# Patient Record
Sex: Female | Born: 1970 | Race: Black or African American | Hispanic: No | Marital: Single | Smoking: Never smoker
Health system: Southern US, Community
[De-identification: ages and names within clinical notes are randomized; demographics above are authoritative.]

## PROBLEM LIST (undated history)

## (undated) DIAGNOSIS — M351 Other overlap syndromes: Secondary | ICD-10-CM

## (undated) DIAGNOSIS — I10 Essential (primary) hypertension: Secondary | ICD-10-CM

## (undated) DIAGNOSIS — G43909 Migraine, unspecified, not intractable, without status migrainosus: Secondary | ICD-10-CM

## (undated) DIAGNOSIS — K589 Irritable bowel syndrome without diarrhea: Secondary | ICD-10-CM

---

## 2004-01-14 HISTORY — PX: PARTIAL HYSTERECTOMY: SHX80

## 2019-12-03 ENCOUNTER — Emergency Department (HOSPITAL_COMMUNITY): Payer: BC Managed Care – PPO

## 2019-12-03 ENCOUNTER — Other Ambulatory Visit: Payer: Self-pay

## 2019-12-03 ENCOUNTER — Observation Stay (HOSPITAL_COMMUNITY)
Admission: EM | Admit: 2019-12-03 | Discharge: 2019-12-04 | Disposition: A | Discharge status: Home / Self Care | Payer: BC Managed Care – PPO | Attending: Internal Medicine | Admitting: Internal Medicine

## 2019-12-03 ENCOUNTER — Encounter (HOSPITAL_COMMUNITY): Payer: Self-pay

## 2019-12-03 DIAGNOSIS — G4733 Obstructive sleep apnea (adult) (pediatric): Secondary | ICD-10-CM | POA: Insufficient documentation

## 2019-12-03 DIAGNOSIS — L949 Localized connective tissue disorder, unspecified: Secondary | ICD-10-CM | POA: Insufficient documentation

## 2019-12-03 DIAGNOSIS — I10 Essential (primary) hypertension: Secondary | ICD-10-CM | POA: Diagnosis not present

## 2019-12-03 DIAGNOSIS — G43909 Migraine, unspecified, not intractable, without status migrainosus: Secondary | ICD-10-CM | POA: Diagnosis not present

## 2019-12-03 DIAGNOSIS — Z20822 Contact with and (suspected) exposure to covid-19: Secondary | ICD-10-CM | POA: Insufficient documentation

## 2019-12-03 DIAGNOSIS — G459 Transient cerebral ischemic attack, unspecified: Secondary | ICD-10-CM | POA: Diagnosis not present

## 2019-12-03 DIAGNOSIS — R531 Weakness: Secondary | ICD-10-CM

## 2019-12-03 DIAGNOSIS — R29898 Other symptoms and signs involving the musculoskeletal system: Secondary | ICD-10-CM | POA: Diagnosis present

## 2019-12-03 HISTORY — DX: Essential (primary) hypertension: I10

## 2019-12-03 HISTORY — DX: Other overlap syndromes: M35.1

## 2019-12-03 HISTORY — DX: Irritable bowel syndrome without diarrhea: K58.9

## 2019-12-03 HISTORY — DX: Migraine, unspecified, not intractable, without status migrainosus: G43.909

## 2019-12-03 LAB — I-STAT CHEM 8, ED
BUN: 5 mg/dL — ABNORMAL LOW (ref 6–20)
Calcium, Ion: 1 mmol/L — ABNORMAL LOW (ref 1.15–1.40)
Chloride: 109 mmol/L (ref 98–111)
Creatinine, Ser: 0.9 mg/dL (ref 0.44–1.00)
Glucose, Bld: 105 mg/dL — ABNORMAL HIGH (ref 70–99)
HCT: 41 % (ref 36.0–46.0)
Hemoglobin: 13.9 g/dL (ref 12.0–15.0)
Potassium: 4.5 mmol/L (ref 3.5–5.1)
Sodium: 138 mmol/L (ref 135–145)
TCO2: 21 mmol/L — ABNORMAL LOW (ref 22–32)

## 2019-12-03 LAB — COMPREHENSIVE METABOLIC PANEL
ALT: 13 U/L (ref 0–44)
AST: 18 U/L (ref 15–41)
Albumin: 3.5 g/dL (ref 3.5–5.0)
Alkaline Phosphatase: 68 U/L (ref 38–126)
Anion gap: 11 (ref 5–15)
BUN: 6 mg/dL (ref 6–20)
CO2: 21 mmol/L — ABNORMAL LOW (ref 22–32)
Calcium: 9 mg/dL (ref 8.9–10.3)
Chloride: 105 mmol/L (ref 98–111)
Creatinine, Ser: 0.95 mg/dL (ref 0.44–1.00)
GFR, Estimated: >60 mL/min (ref >60)
Glucose, Bld: 106 mg/dL — ABNORMAL HIGH (ref 70–99)
Potassium: 3.8 mmol/L (ref 3.5–5.1)
Sodium: 137 mmol/L (ref 135–145)
Total Bilirubin: 0.6 mg/dL (ref 0.3–1.2)
Total Protein: 7 g/dL (ref 6.5–8.1)

## 2019-12-03 LAB — CBC
HCT: 42.1 % (ref 36.0–46.0)
Hemoglobin: 12.8 g/dL (ref 12.0–15.0)
MCH: 26.2 pg (ref 26.0–34.0)
MCHC: 30.4 g/dL (ref 30.0–36.0)
MCV: 86.1 fL (ref 80.0–100.0)
Platelets: 292 10*3/uL (ref 150–400)
RBC: 4.89 MIL/uL (ref 3.87–5.11)
RDW: 14.8 % (ref 11.5–15.5)
WBC: 10.7 10*3/uL — ABNORMAL HIGH (ref 4.0–10.5)
nRBC: 0 % (ref 0.0–0.2)

## 2019-12-03 LAB — DIFFERENTIAL
Abs Immature Granulocytes: 0.05 10*3/uL (ref 0.00–0.07)
Basophils Absolute: 0 10*3/uL (ref 0.0–0.1)
Basophils Relative: 0 %
Eosinophils Absolute: 0.2 10*3/uL (ref 0.0–0.5)
Eosinophils Relative: 2 %
Immature Granulocytes: 1 %
Lymphocytes Relative: 44 %
Lymphs Abs: 4.7 10*3/uL — ABNORMAL HIGH (ref 0.7–4.0)
Monocytes Absolute: 0.6 10*3/uL (ref 0.1–1.0)
Monocytes Relative: 5 %
Neutro Abs: 5.1 10*3/uL (ref 1.7–7.7)
Neutrophils Relative %: 48 %

## 2019-12-03 LAB — RESPIRATORY PANEL BY RT PCR (FLU A&B, COVID)
Influenza A by PCR: NEGATIVE
Influenza B by PCR: NEGATIVE
SARS Coronavirus 2 by RT PCR: NEGATIVE

## 2019-12-03 LAB — CBG MONITORING, ED: Glucose-Capillary: 179 mg/dL — ABNORMAL HIGH (ref 70–99)

## 2019-12-03 LAB — PROTIME-INR
INR: 1 (ref 0.8–1.2)
Prothrombin Time: 13.1 seconds (ref 11.4–15.2)

## 2019-12-03 LAB — I-STAT BETA HCG BLOOD, ED (MC, WL, AP ONLY): I-stat hCG, quantitative: <5 m[IU]/mL (ref <5)

## 2019-12-03 LAB — APTT: aPTT: 30 seconds (ref 24–36)

## 2019-12-03 IMAGING — MR MR HEAD W/O CM
1 series · 15 of 15 positions shown · non-contrast
Comparison: Plain head CT [W1] hours.

CLINICAL DATA: 49-year-old female code stroke presentation. Acute
left side weakness and numbness, expressive aphasia.

EXAM:
MRI HEAD WITHOUT CONTRAST
MRA HEAD WITHOUT CONTRAST
TECHNIQUE: Multiplanar, multiecho pulse sequences of the brain and surrounding
structures were obtained without intravenous contrast. Angiographic
images of the head were obtained using MRA technique without
contrast.

[Series 301: pjn:ax (id) · sagittal · 1.0mm · 0.43mm/px · 15 of 15 slices shown]
[im 1/15]
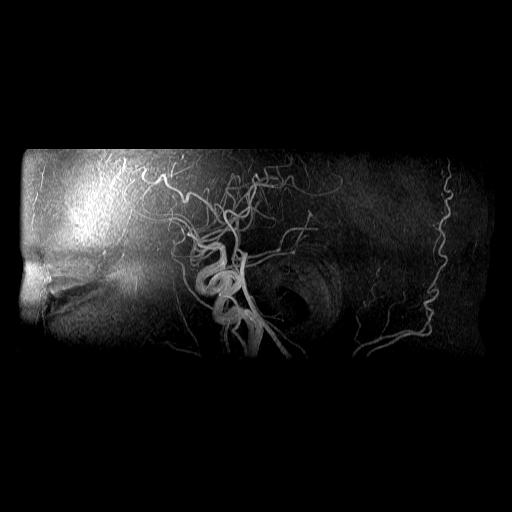
[im 2/15]
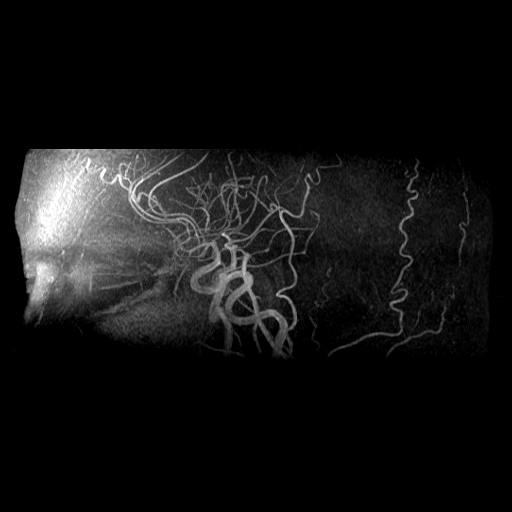
[im 3/15]
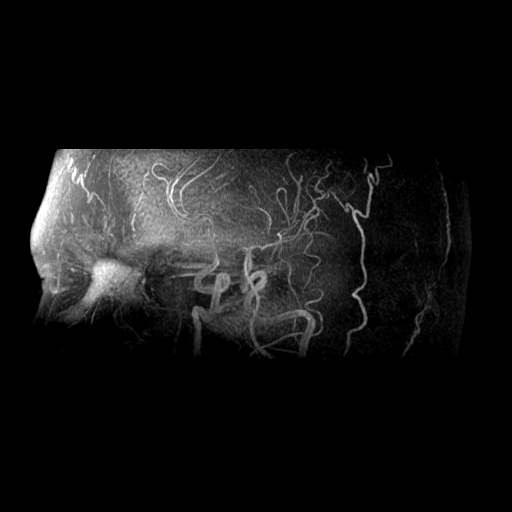
[im 4/15]
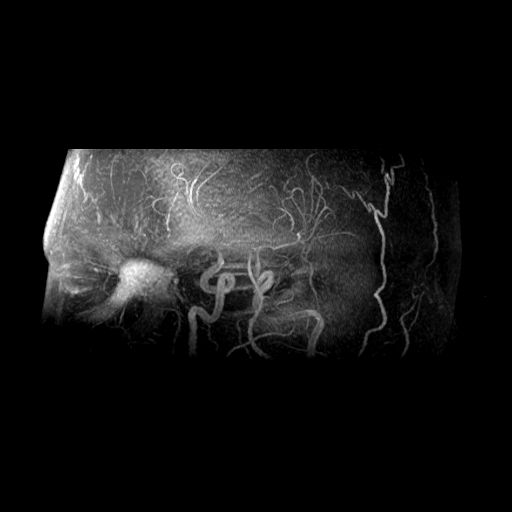
[im 5/15]
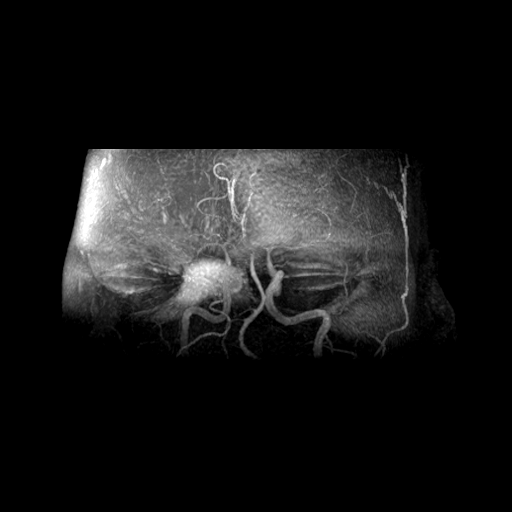
[im 6/15]
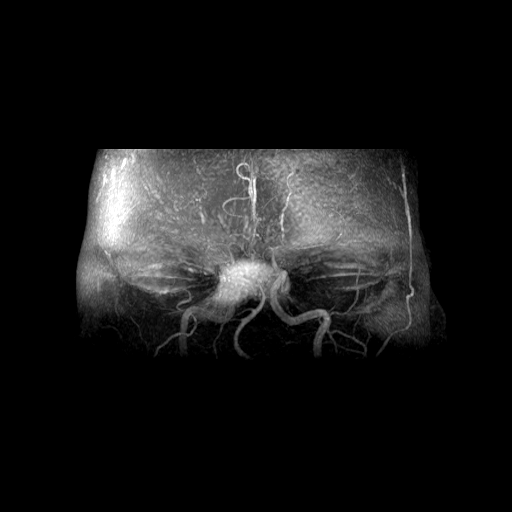
[im 7/15]
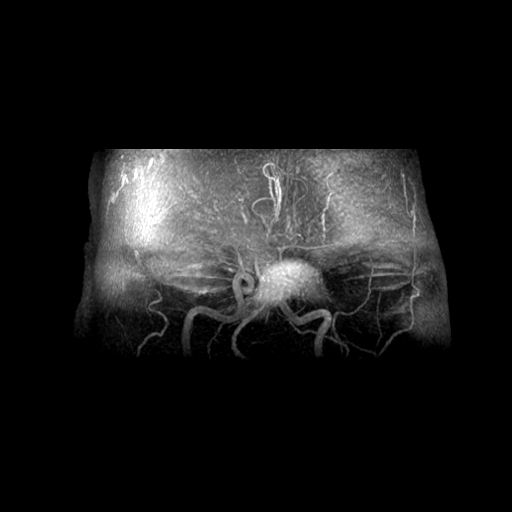
[im 8/15]
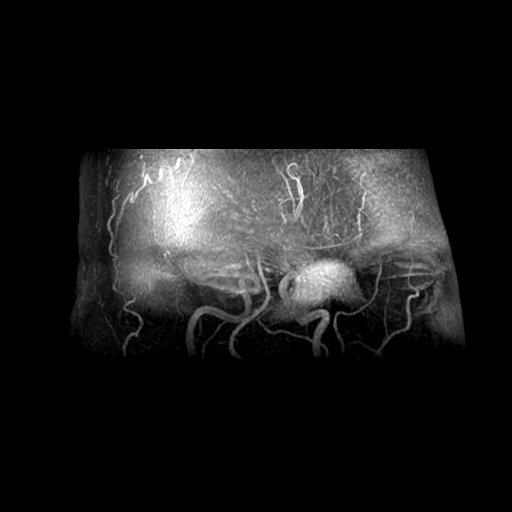
[im 9/15]
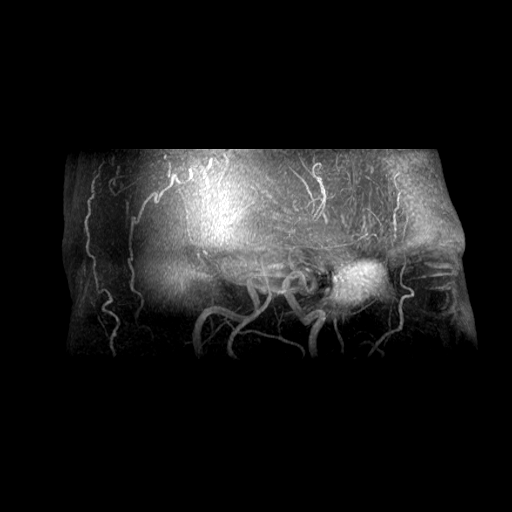
[im 10/15]
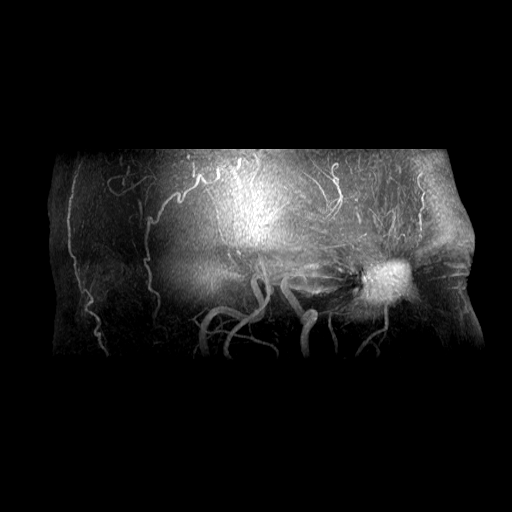
[im 11/15]
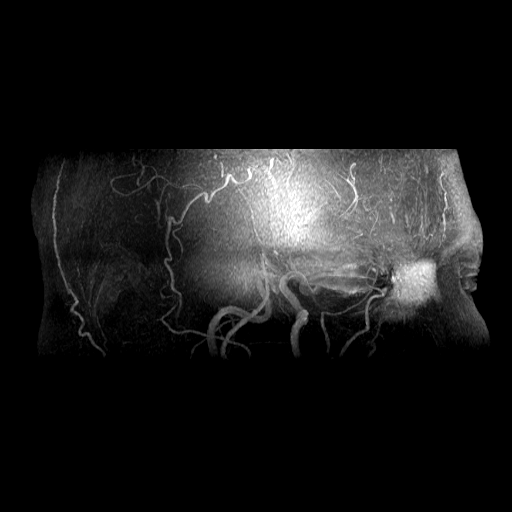
[im 12/15]
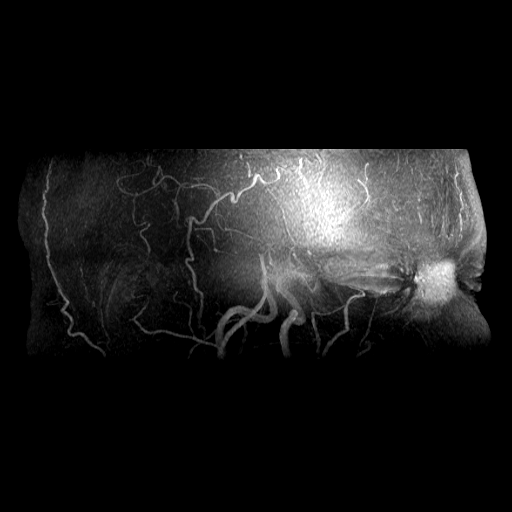
[im 13/15]
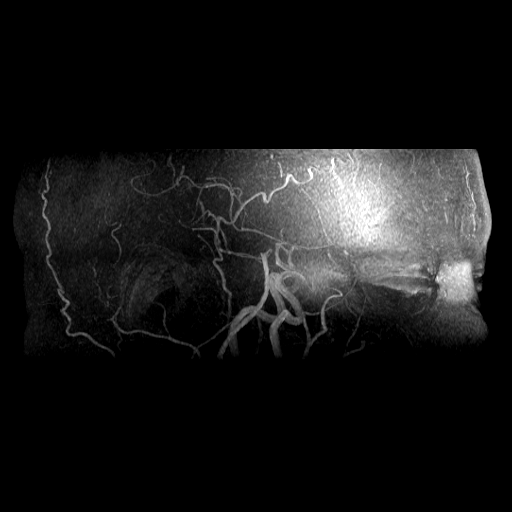
[im 14/15]
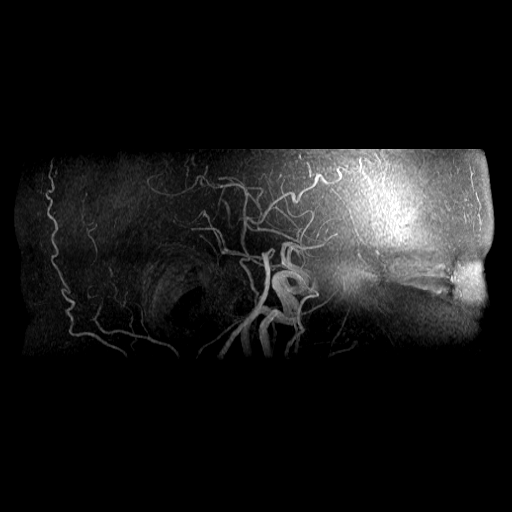
[im 15/15]
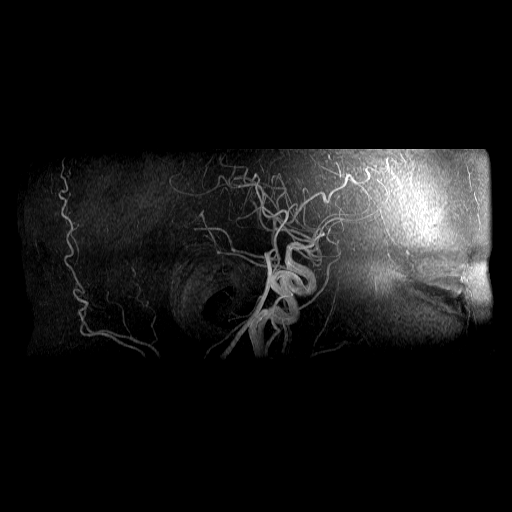

[15 of 15 positions shown; findings below may reference images not displayed]

FINDINGS: MRI HEAD FINDINGS

Brain: No restricted diffusion or evidence of acute infarction.
No midline shift, mass effect, evidence of mass lesion,
ventriculomegaly, extra-axial collection or acute intracranial
hemorrhage. Cervicomedullary junction and pituitary are within
normal limits.

Gray and white matter signal is within normal limits throughout the
brain. No convincing encephalomalacia. No chronic cerebral blood
products identified.

Vascular: Major intracranial vascular flow voids are preserved.

Skull and upper cervical spine: Negative visible cervical spine.
Visualized bone marrow signal is within normal limits.

Sinuses/Orbits: Negative orbits.  Paranasal sinuses are clear.

Other: Mastoids are clear. Grossly normal visible internal auditory
structures.

Mild scaphocephaly. Visible scalp and face soft tissues appear
negative.

MRA HEAD FINDINGS

Antegrade flow in the posterior circulation with dominant appearing
distal left vertebral artery. No distal vertebral stenosis. Patent
vertebrobasilar junction. Patent dominant appearing bilateral AICA
origins. Patent basilar artery without stenosis. Patent SCA
(duplicated on the left, normal variant) and right PCA origin. Fetal
type left PCA origin. Right posterior communicating artery also
present. Tortuous right P1 segment. Bilateral PCA branches are
within normal limits.

Antegrade flow in both ICA siphons. Mildly tortuous cavernous ICAs.
No siphon stenosis. Bilateral ophthalmic and posterior communicating
artery origins appear normal. Patent carotid termini, MCA and ACA
origins. Anterior communicating artery and visible ACA branches are
within normal limits. Bilateral MCA M1 segments and MCA
bi/trifurcations are patent. The right MCA bifurcates early, normal
variant. Visible bilateral MCA branches are within normal limits.
IMPRESSION: 1. Normal noncontrast MRI appearance of the brain.

2. Negative intracranial MRA.

## 2019-12-03 IMAGING — CT CT HEAD CODE STROKE
4 series · 16 of 47 positions shown, 18 images · non-contrast
Comparison: None.

CLINICAL DATA: Code stroke. 49-year-old female with left side
weakness and aphasia.

EXAM:
CT HEAD WITHOUT CONTRAST
TECHNIQUE: Contiguous axial images were obtained from the base of the skull
through the vertex without intravenous contrast.

[Series 3: head wo · axial · 0.43mm/px · z∈[+1110,+1224]mm · 7 of 31 slices shown, 9 images]
[im 4/31  brain]
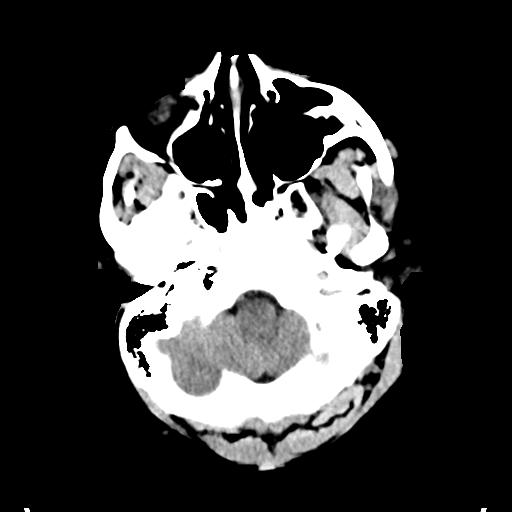
[im 4/31  bone]
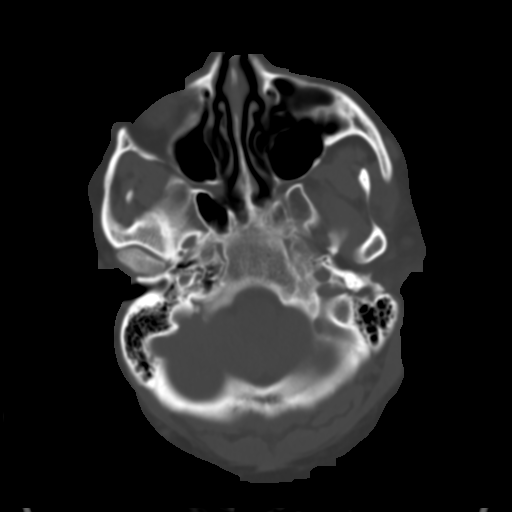
[im 8/31  brain]
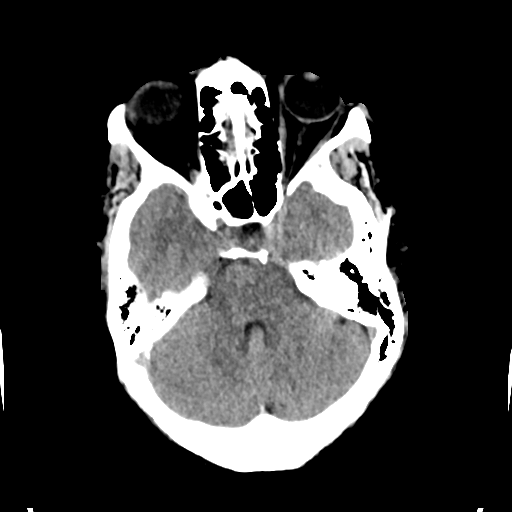
[im 12/31  brain]
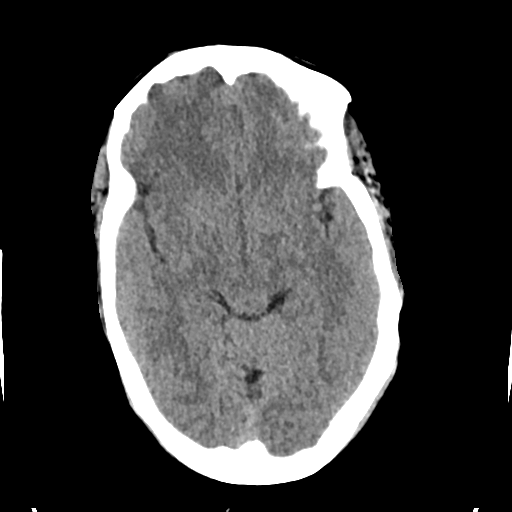
[im 16/31  brain]
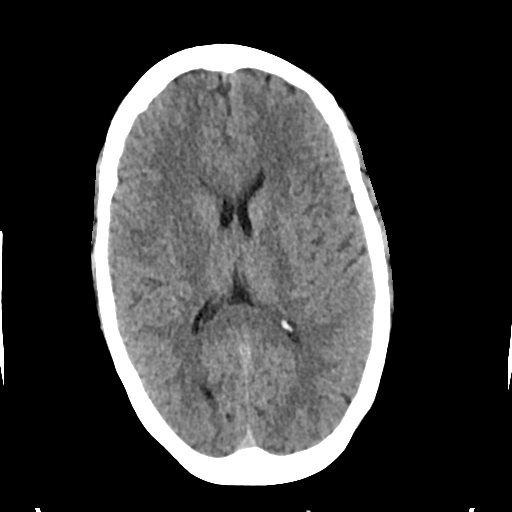
[im 19/31  brain]
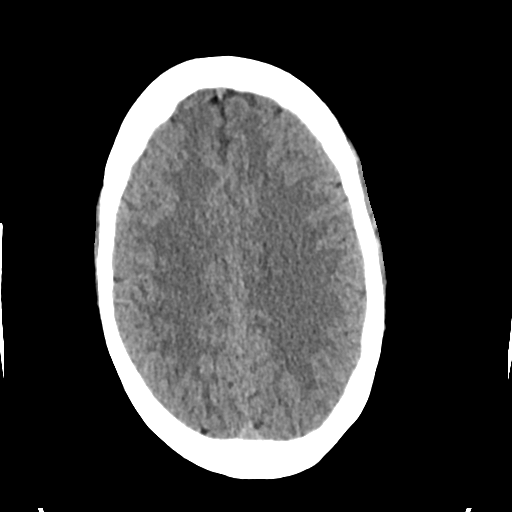
[im 19/31  bone]
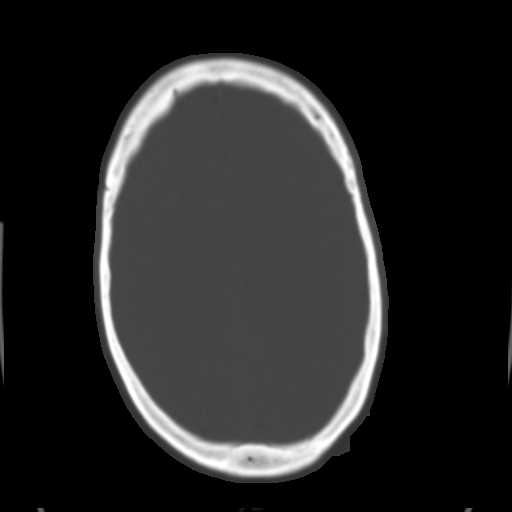
[im 23/31  brain]
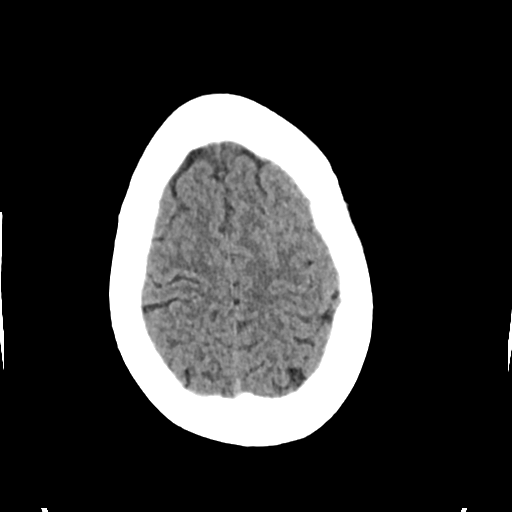
[im 27/31  brain]
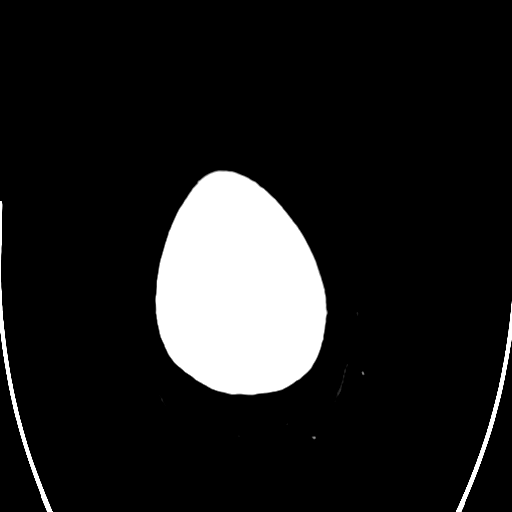

[Series 4: head bone · axial · 0.43mm/px · z∈[+1108,+1138]mm · 3 of 76 slices shown]
[im 8/76  bone]
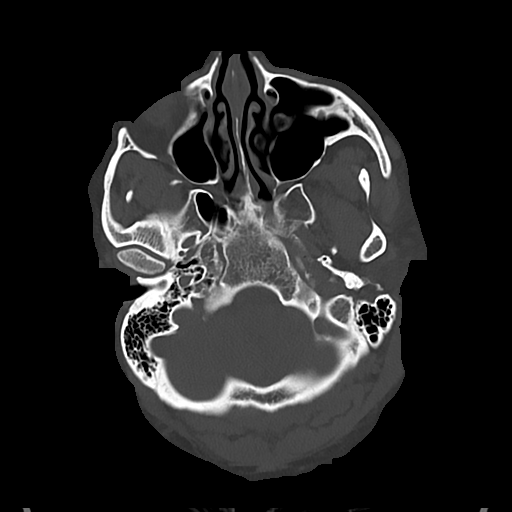
[im 16/76  bone]
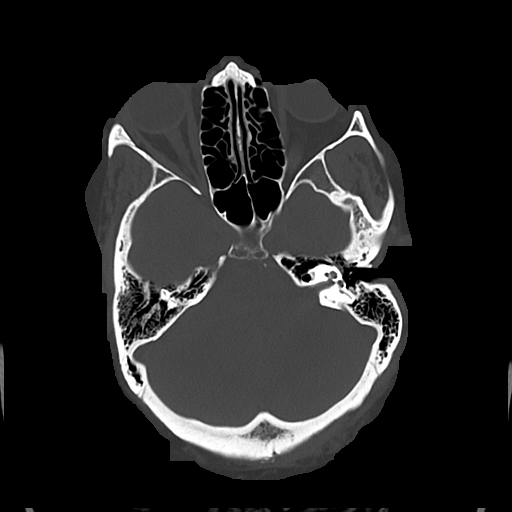
[im 23/76  bone]
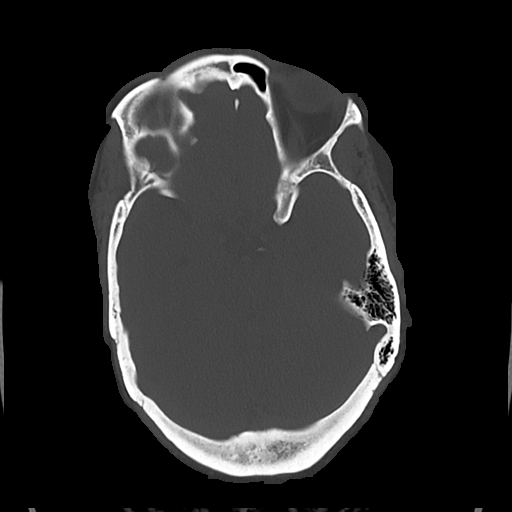

[Series 5: cor soft · coronal · 0.33mm/px · 3 of 69 slices shown]
[im 23/69  brain]
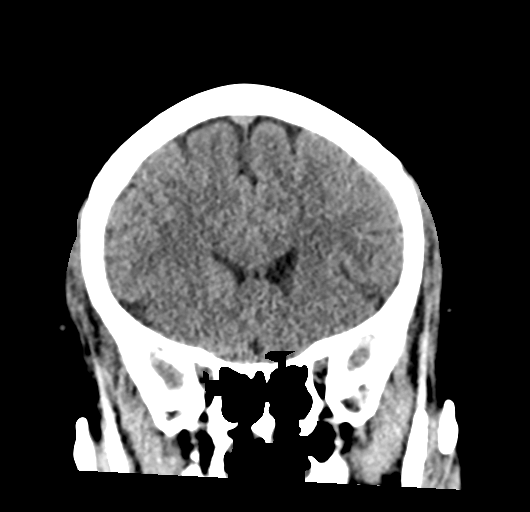
[im 31/69  brain]
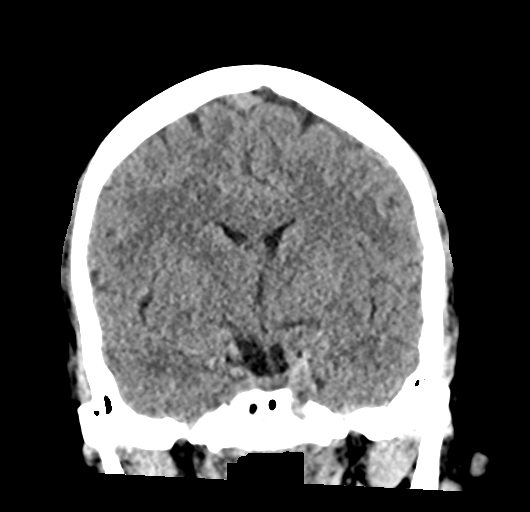
[im 38/69  brain]
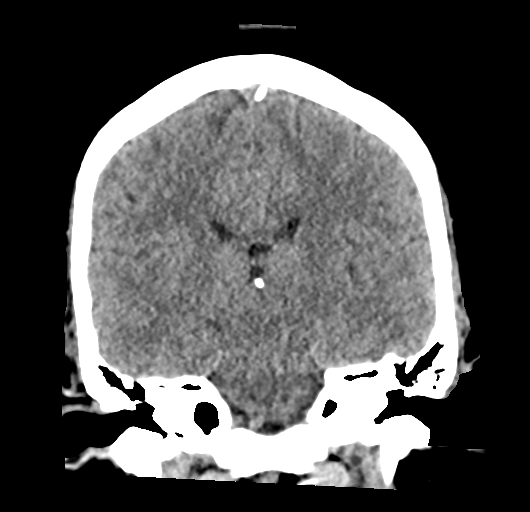

[Series 6: sag soft · sagittal · 0.33mm/px · 3 of 48 slices shown]
[im 16/48  brain]
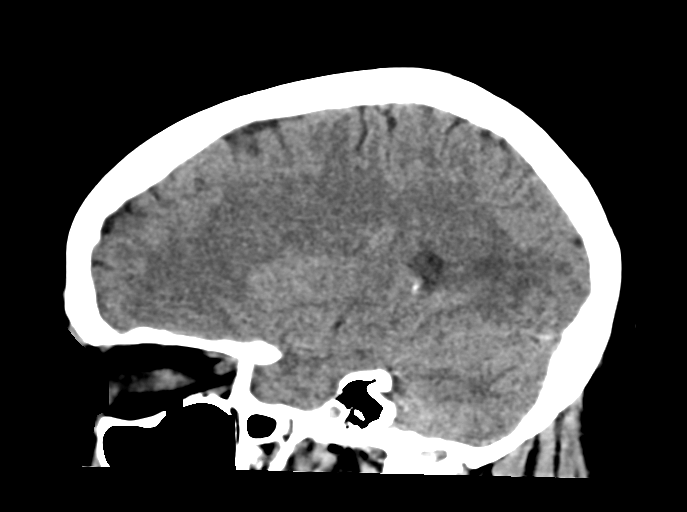
[im 24/48  brain]
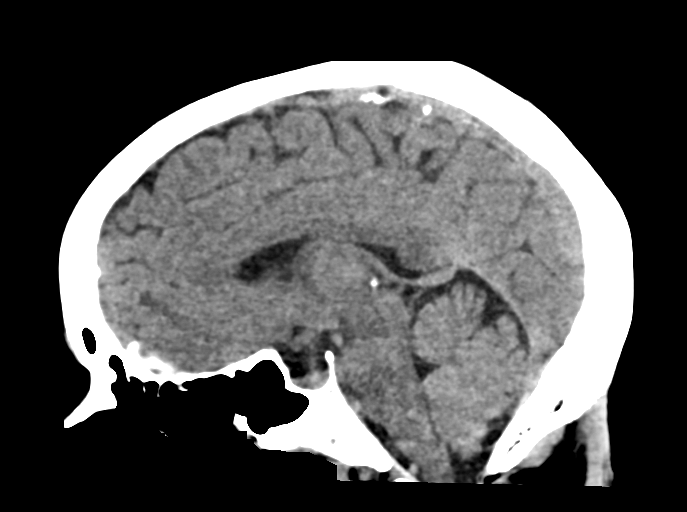
[im 32/48  brain]
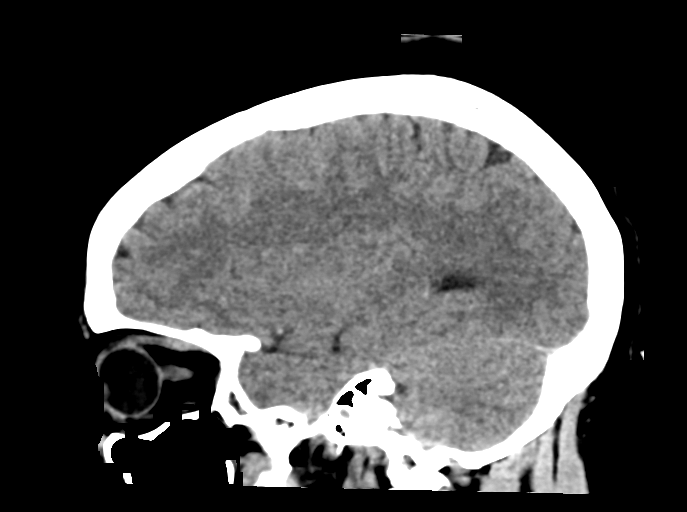

[16 of 47 positions shown; findings below may reference images not displayed]

FINDINGS: Brain: Mild scaphocephaly.  Normal cerebral volume.

No midline shift, ventriculomegaly, mass effect, evidence of mass
lesion, intracranial hemorrhage or evidence of cortically based
acute infarction. Gray-white matter differentiation is within normal
limits throughout the brain.

Vascular: No suspicious intracranial vascular hyperdensity.

Skull: Small benign right frontal bone lesions suspected on series
4, image 27. Otherwise negative.

Sinuses/Orbits: Visualized paranasal sinuses and mastoids are stable
and well pneumatized.

Other: Visualized orbits and scalp soft tissues are within normal
limits.

ASPECTS (Alberta Stroke Program Early CT Score)

Total score (0-10 with 10 being normal): 10
IMPRESSION: Normal noncontrast CT appearance of the brain.  ASPECTS 10.

These results were communicated to Dr. CIPRIANI at [DATE] on
[DATE] by text page via the [employer] messaging system.

## 2019-12-03 IMAGING — MR MR MRA HEAD W/O CM
9 of 11 series · 31 of 48 positions shown · non-contrast
Comparison: Plain head CT [W1] hours.

CLINICAL DATA: 49-year-old female code stroke presentation. Acute
left side weakness and numbness, expressive aphasia.

EXAM:
MRI HEAD WITHOUT CONTRAST
MRA HEAD WITHOUT CONTRAST
TECHNIQUE: Multiplanar, multiecho pulse sequences of the brain and surrounding
structures were obtained without intravenous contrast. Angiographic
images of the head were obtained using MRA technique without
contrast.

[Series 3: ax (id) · axial · 1.0mm · 0.43mm/px · z∈[-160,-110]mm · 5 of 184 slices shown]
[im 1/184]
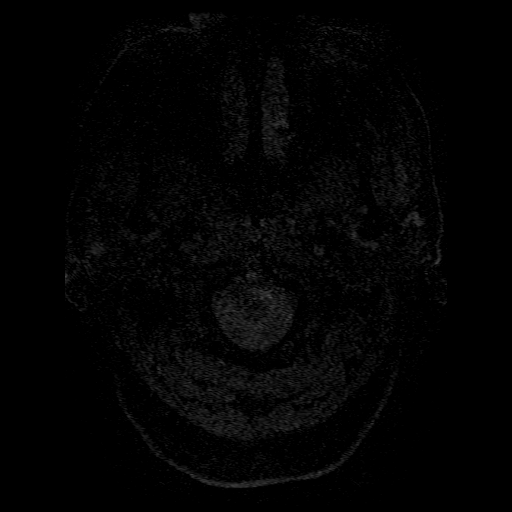
[im 37/184]
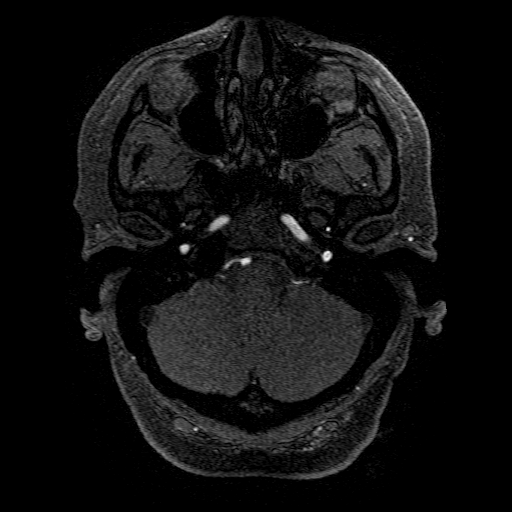
[im 55/184]
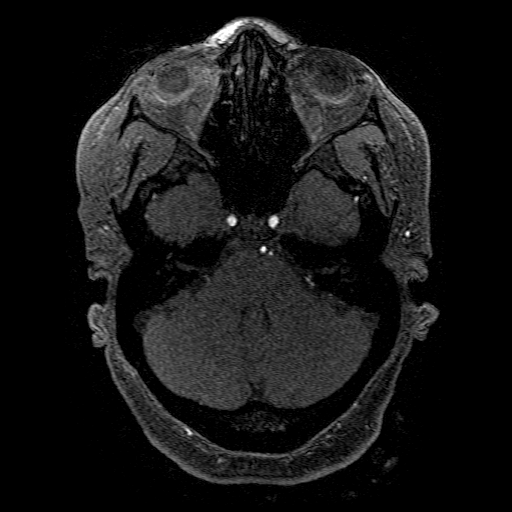
[im 74/184]
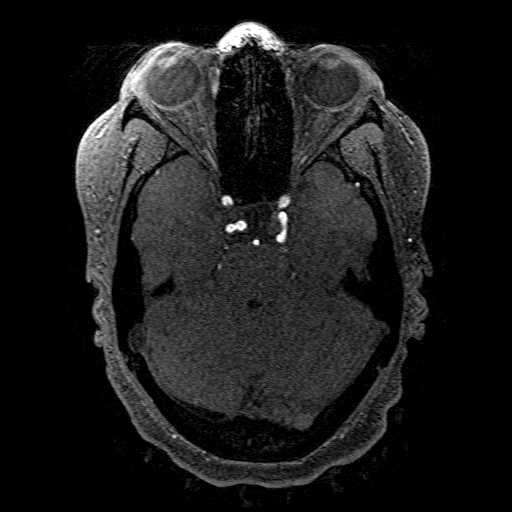
[im 110/184]
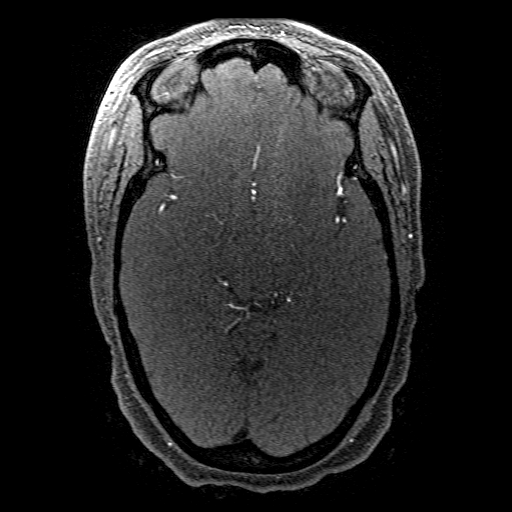

[Series 4: DWI · axial · 3.0mm · 0.94mm/px · z∈[-177,-43]mm · 7 of 100 slices shown (1 of 2)]
[im 1/100]
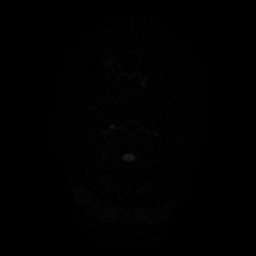
[im 17/100]
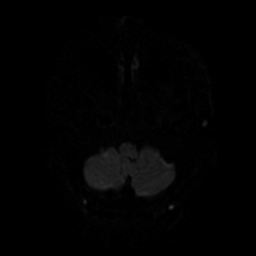
[im 34/100]
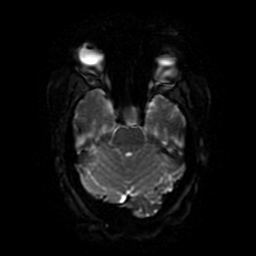
[im 50/100]
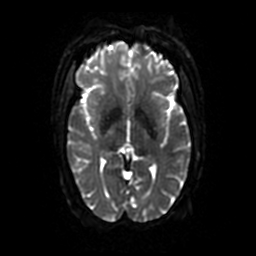
[im 67/100]
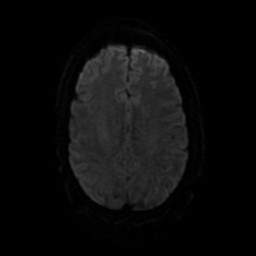
[im 83/100]
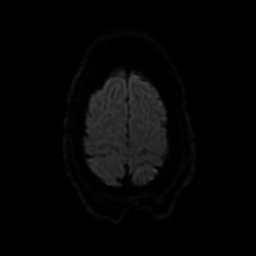
[im 100/100]
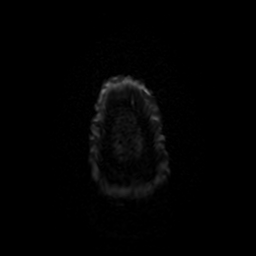

[Series 5: DWI · coronal · 4.0mm · 0.94mm/px · 5 of 74 slices shown (2 of 2)]
[im 1/74]
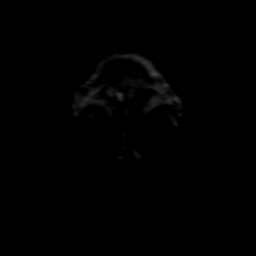
[im 19/74]
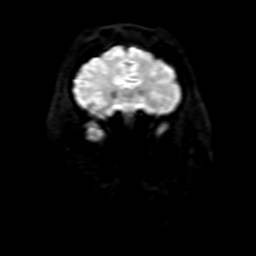
[im 37/74]
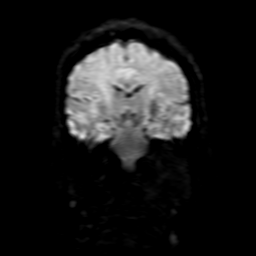
[im 55/74]
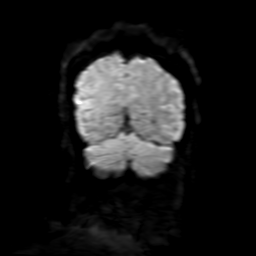
[im 74/74]
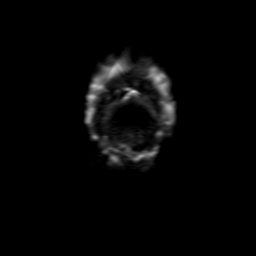

[Series 6: FLAIR · axial · 3.0mm · 0.45mm/px · z∈[-192,-63]mm · 2 of 26 slices shown (1 of 2)]
[im 1/26]
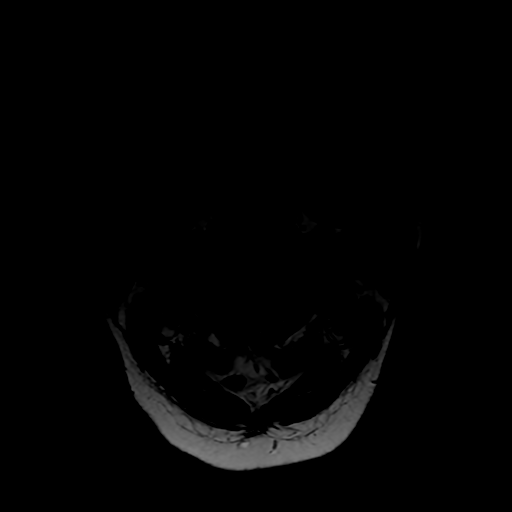
[im 26/26]
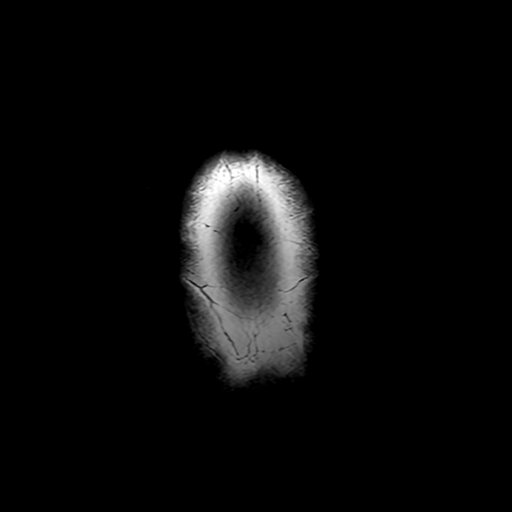

[Series 8: T2 · axial · 5.0mm · 0.47mm/px · z∈[-195,-65]mm · 2 of 26 slices shown (1 of 2)]
[im 1/26]
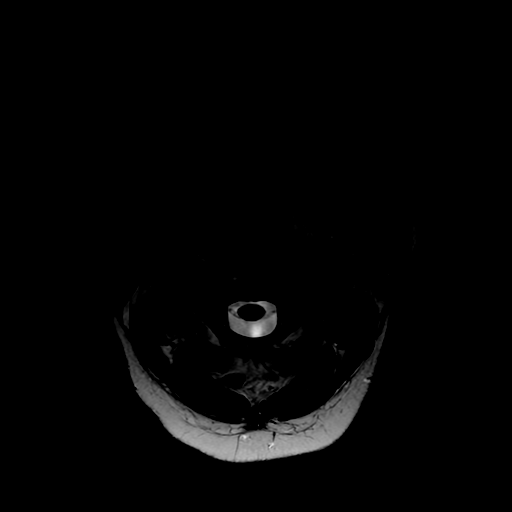
[im 26/26]
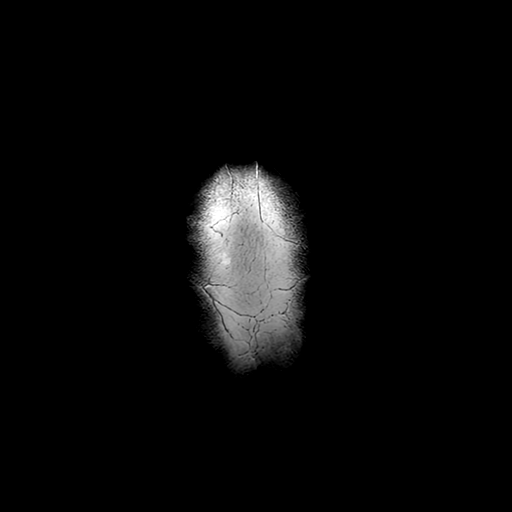

[Series 9: FLAIR · sagittal · 5.0mm · 0.47mm/px · 2 of 23 slices shown (2 of 2)]
[im 1/23]
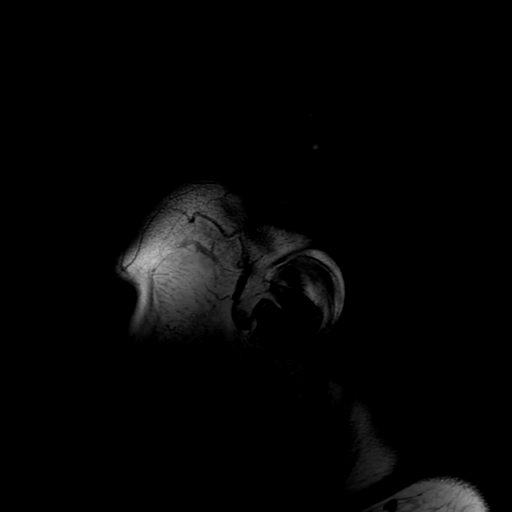
[im 23/23]
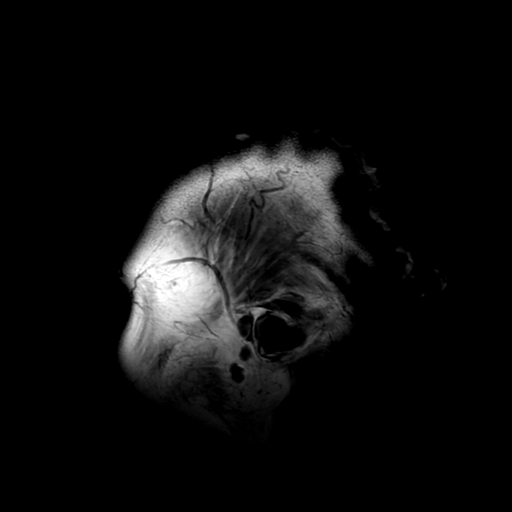

[Series 11: T2 · coronal · 5.0mm · 0.39mm/px · 2 of 31 slices shown (2 of 2)]
[im 1/31]
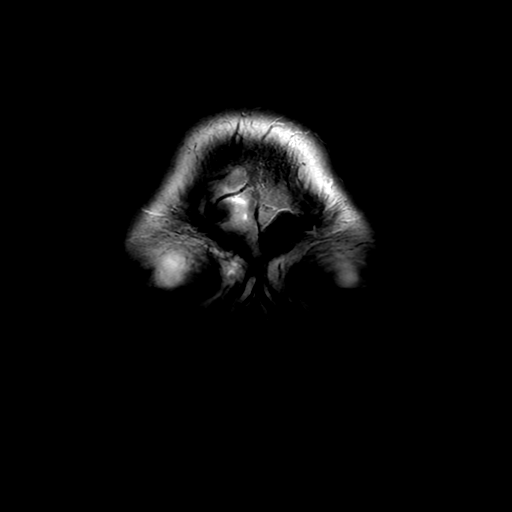
[im 31/31]
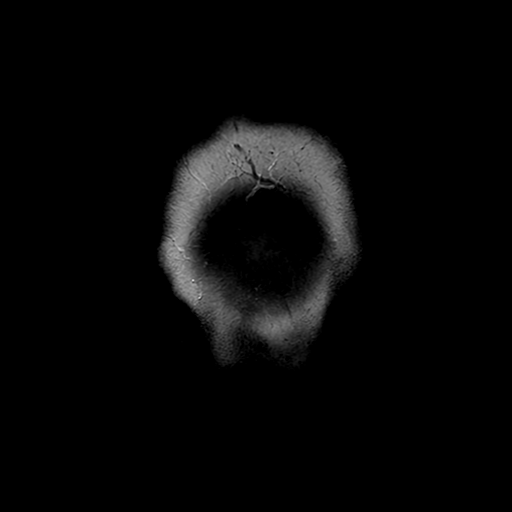

[Series 450: ADC · axial · 3.0mm · 0.94mm/px · z∈[-177,-43]mm · 3 of 50 slices shown (1 of 2)]
[im 1/50]
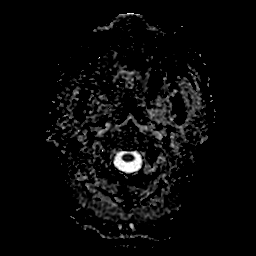
[im 25/50]
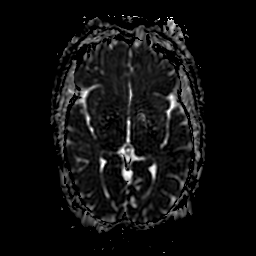
[im 50/50]
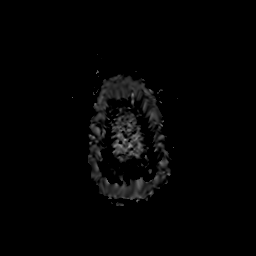

[Series 550: ADC · coronal · 4.0mm · 0.94mm/px · 3 of 37 slices shown (2 of 2)]
[im 1/37]
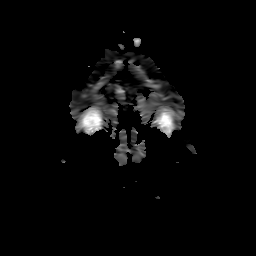
[im 19/37]
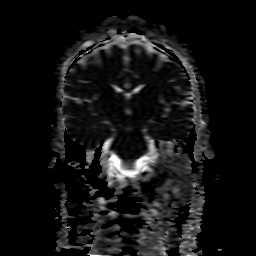
[im 37/37]
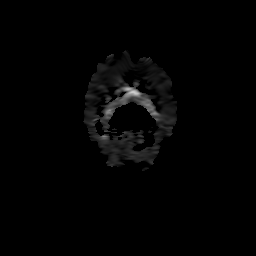

[31 of 48 positions shown; findings below may reference images not displayed]

FINDINGS: MRI HEAD FINDINGS

Brain: No restricted diffusion or evidence of acute infarction.
No midline shift, mass effect, evidence of mass lesion,
ventriculomegaly, extra-axial collection or acute intracranial
hemorrhage. Cervicomedullary junction and pituitary are within
normal limits.

Gray and white matter signal is within normal limits throughout the
brain. No convincing encephalomalacia. No chronic cerebral blood
products identified.

Vascular: Major intracranial vascular flow voids are preserved.

Skull and upper cervical spine: Negative visible cervical spine.
Visualized bone marrow signal is within normal limits.

Sinuses/Orbits: Negative orbits.  Paranasal sinuses are clear.

Other: Mastoids are clear. Grossly normal visible internal auditory
structures.

Mild scaphocephaly. Visible scalp and face soft tissues appear
negative.

MRA HEAD FINDINGS

Antegrade flow in the posterior circulation with dominant appearing
distal left vertebral artery. No distal vertebral stenosis. Patent
vertebrobasilar junction. Patent dominant appearing bilateral AICA
origins. Patent basilar artery without stenosis. Patent SCA
(duplicated on the left, normal variant) and right PCA origin. Fetal
type left PCA origin. Right posterior communicating artery also
present. Tortuous right P1 segment. Bilateral PCA branches are
within normal limits.

Antegrade flow in both ICA siphons. Mildly tortuous cavernous ICAs.
No siphon stenosis. Bilateral ophthalmic and posterior communicating
artery origins appear normal. Patent carotid termini, MCA and ACA
origins. Anterior communicating artery and visible ACA branches are
within normal limits. Bilateral MCA M1 segments and MCA
bi/trifurcations are patent. The right MCA bifurcates early, normal
variant. Visible bilateral MCA branches are within normal limits.
IMPRESSION: 1. Normal noncontrast MRI appearance of the brain.

2. Negative intracranial MRA.

## 2019-12-03 MED ORDER — ONDANSETRON HCL 4 MG PO TABS: 4.0000 mg | ORAL_TABLET | Freq: Four times a day (QID) | ORAL | Status: DC | PRN

## 2019-12-03 MED ORDER — CARVEDILOL 3.125 MG PO TABS
3.1250 mg | ORAL_TABLET | Freq: Two times a day (BID) | ORAL | Status: DC
  Administered 2019-12-03 – 2019-12-04 (×2): 3.125 mg via ORAL
  Filled 2019-12-03 (×3): qty 1

## 2019-12-03 MED ORDER — ONDANSETRON HCL 4 MG/2ML IJ SOLN: 4.0000 mg | Freq: Four times a day (QID) | INTRAMUSCULAR | Status: DC | PRN

## 2019-12-03 MED ORDER — ASPIRIN EC 81 MG PO TBEC
81.0000 mg | DELAYED_RELEASE_TABLET | Freq: Every day | ORAL | Status: DC
  Administered 2019-12-03 – 2019-12-04 (×2): 81 mg via ORAL
  Filled 2019-12-03 (×2): qty 1

## 2019-12-03 MED ORDER — SODIUM CHLORIDE 0.9% FLUSH
3.0000 mL | Freq: Once | INTRAVENOUS | Status: DC
Start: 2019-12-03 — End: 2019-12-04

## 2019-12-03 MED ORDER — ENOXAPARIN SODIUM 60 MG/0.6ML ~~LOC~~ SOLN
55.0000 mg | SUBCUTANEOUS | Status: DC
  Administered 2019-12-03: 55 mg via SUBCUTANEOUS
  Filled 2019-12-03 (×2): qty 0.6
  Filled 2019-12-03: qty 0.55

## 2019-12-03 MED ORDER — ACETAMINOPHEN 650 MG RE SUPP: 650.0000 mg | Freq: Four times a day (QID) | RECTAL | Status: DC | PRN

## 2019-12-03 MED ORDER — ACETAMINOPHEN 325 MG PO TABS: 650.0000 mg | ORAL_TABLET | Freq: Four times a day (QID) | ORAL | Status: DC | PRN

## 2019-12-03 MED ORDER — GABAPENTIN 600 MG PO TABS
300.0000 mg | ORAL_TABLET | Freq: Every day | ORAL | Status: DC
  Administered 2019-12-03: 300 mg via ORAL
  Filled 2019-12-03: qty 1
  Filled 2019-12-03: qty 0.5

## 2019-12-03 MED ORDER — PRAVASTATIN SODIUM 10 MG PO TABS
20.0000 mg | ORAL_TABLET | Freq: Every day | ORAL | Status: DC
  Administered 2019-12-03: 20 mg via ORAL
  Filled 2019-12-03 (×3): qty 2

## 2019-12-03 MED ORDER — SENNOSIDES-DOCUSATE SODIUM 8.6-50 MG PO TABS: 1.0000 | ORAL_TABLET | Freq: Every evening | ORAL | Status: DC | PRN

## 2019-12-03 NOTE — H&P (Signed)
Date: 12/03/2019               Patient Name:  Andrea Prince MRN: [id number]  DOB: 07/13/1970 Age / Sex: 49 y.o., female   PCP: Patient, No Pcp Per         Medical Service: Internal Medicine Teaching Service         Attending Physician: Dr. Mayford Knife, Dorene Ar, MD    First Contact: Dr. Gerarda Fraction Pager: [phone]  Second Contact: Dr. Mcarthur Rossetti Pager: [phone]       After Hours (After 5p/  First Contact Pager: [phone]  weekends / holidays): Second Contact Pager: [phone]   Chief Complaint: Left sided numbness and weakness  History of Present Illness: Andrea Prince is an 49 y.o. female with a PMHx of HTN, mixed connective tissue disease, migraines, IBS with constipation, GERD, anxiety who presents via EMS as a Code Stroke. She was in her usual state of health until this morning when she started having tingling in her left arm and left leg while shopping. Went to her car,started driving but due to worsening stopped at light and asked nearby people for help and they suggested to pull in nearby parking. From there called 911, but took her sometime to explain the situation due to impaired speech. States had similar episode in arm and saw cardiology recently with extensive work up. They cleared her off and today had same presentation but also in her leg. States sees chiropractor 3 times/week for her neck pain and lasts seen was on Monday. Denies any new stress at home or work. Denies any abuse, feels safe at home.  ED course: EMS VS: 88 HR NSR, BP 146/100, CBG 103 98% RA. Neurologist was consulted and they assessment is several inconsistencies on exam, CT head showed no acute abnormality, MRI brain was normal. 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  Meds:  Meds ordered this encounter  Medications  . sodium chloride flush (NS) 0.9 % injection 3 mL  . enoxaparin (LOVENOX) injection 55 mg  . OR Linked Order Group   .  acetaminophen (TYLENOL) tablet 650 mg   . acetaminophen (TYLENOL) suppository 650 mg  . senna-docusate (Senokot-S) tablet 1 tablet  . OR Linked Order Group   . ondansetron (ZOFRAN) tablet 4 mg   . ondansetron (ZOFRAN) injection 4 mg  . aspirin EC tablet 81 mg  . pravastatin (PRAVACHOL) tablet 20 mg  . gabapentin (NEURONTIN) tablet 300 mg  . carvedilol (COREG) tablet 3.125 mg    No outpatient medications have been marked as taking for the 12/03/19 encounter [facility] Encounter).     Allergies: Allergies as of 12/03/2019  . (No Known Allergies)   Past Medical History:  Diagnosis Date  . Hypertension   . IBS (irritable bowel syndrome)   . Migraines   . Mixed connective tissue disease (HCC)    Family History:  Arthritis in her father; Breast cancer (age of onset: 19) in her mother; Breast cancer (age of onset: 44) in her paternal aunt; Cervical cancer in her cousin and cousin; Colon polyps in her father; Colon polyps (age of onset: 61) in her mother; Diabetes in her mother; Heart disease in her maternal grandmother and mother; Hypertension in her maternal grandmother and mother; Migraines in her maternal aunt and paternal aunt; Pancreatic cancer (age of onset: 33) in her maternal uncle; Rheum arthritis in her father; Sarcoidosis in her mother; Stroke in her maternal  grandfather, maternal uncle, and paternal grandfather; Vision loss in her mother.    Social History: Divorced, Had 2 children, co-parent with x-husband, Lives with 28 year old daughter, Son lives in [place]. 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 Denies smoking, denies alcohol use or  illicit drug abuse.  Review of Systems: A complete ROS was negative except as per HPI.   Physical Exam: Blood pressure 121/73, pulse 67, temperature 98.6 F (37 C), temperature source Oral, resp. rate 20, height 5\' 6"  (1.676 m), weight 110.9 kg, SpO2 99 %.  General: Obese, middle age women lying comfortably in bed, NAD HEENT: Normocephalic,  atraumatic, PERRL, MMM, no nasal congestion, no tonsillar erythema/ exudates/exudates/enlargements, central uvula, MMM Lungs: NT, Breath sounds normal, no wheezes/crackles Cardiology: Regular rate and rhythm, Normal S1, S2,  No gallop, rub, or murmur.  Abdomen: Bowel sounds+, ND/ NT, soft. Extremities: no edema/cyanosis/clubbing, peripheral pulses 2+,  Neuro: AAO*3, Intact speech/ No dysarthria, obey commands.  Cranial Nerves II:  Visual fields intact. PERRL.  III,IV, VI: No ptosis. EOMI. No nystagmus.  V,VII: Smile symmetric.  VIII: hearing intact to voice IX,X: Palate elevates normally  XI: Decreased effort with shoulder shrug on the left, but able to hold head normally at the midline XII: Partly extends tongue so only the tip is showing. Limited protrusion is midline.  Motor: RUE & LUE and RLE 5/5, LUE &LLE 4/5  Sensory: Fine Touch sensation decreased in UE & LE & Face V2, V3 region. Psych: Anxious mood and affect, Normal thought content, normal judgement.  EKG: personally reviewed my interpretation is normal sinus rhythm.  CXR: N/A  Assessment & Plan by Problem:  Andrea Prince is a 49 y.o. female with a  HTN, mixed connective tissue disease, migraines, IBS with constipation, GERD, anxiety who presents via EMS as a Code Stroke under observation for LLE weakness.   Active Problems:   Left arm weakness  # Left Extremities weakness # Cervical radiculopathy  Patient presented with left sided paresthesias as well as weakness via EMS. Code Stroke was called. STAT MRI brain with MRA head was ordered to rule out stroke given the equivocal findings on her exam. MRI brain was normal. Her intracranial MRA was also normal. Had similar episodes in past in Left upper extremity and had an extensive workup. She stated during her cardiac work up she was told, she had an abnormal heart rythm, although not seen on EKG.Diagnosed with cervical radiculopathy and is on gabapentin for it. New onset left  lower extremity paresthesia & weakness which resolved in ED but decreased motor strength 4/5 in LUE & LLE. Unclear diagnosis at this point. Radiculopathy vs TIA. Although, not a presentation of TIA as she endorsing symptoms. Had extensive work up and results are From November 2021 - K4.5, creatinine 0.89, GFR 89, LFTs normal, LDL 136, HDL 42, TG 73, glucose 85, TSH 1.2, WBC 9.2, Hgb 13.5, MCV 81, platelets 294. TSH 0.679 from March, 2021. LDL 109, HDL 50, triglycerides 67 from August 13, 2018. Patient already on Aspirin & Statin therapy.   - C/W Aspirin 81 mg - C/W Pravastatin 20 mg - Follow up CBC, CMP - Follow up HIV, TSH - PT/OT eval  # Hypertension  # OSA Patient has h/o elevated blood pressure, takes Carvedilol at home. After recent sleep study, got diagnosed with OSA. States CPAP is back-ordered, will start using it, once it arrives.   - Carvedilol 3.125 mg BID  # Migraine Uses Emgality 120 mg as needed for her migraine. Had  good relief with Maxalt 10 mh PRN but her cardiologist discontinued it.    # Connective Tissue Disease In past, in 2000 was diagnosed with Lupus, then couple of years ago with Connective Tissue Disease. Typically she has very tight-feeling skin, burning skin, and pain in her joints. She takes Galcanezumab-gnlm 120 MG/ML SOAJ Inject 1 mL into the skin every 30 (thirty) days. MAINTENANCE DOSE 1 pen 11    Dispo: Admit patient to Observation with expected length of stay less than 2 midnights.  Signed: Arnoldo Lenis, MD 12/03/2019, 6:07 PM  Pager: [phone] After 5pm on weekdays and 1pm on weekends: On Call pager: [phone]

## 2019-12-03 NOTE — ED Provider Notes (Signed)
[facility] Provider Note   CSN: [id number] Arrival date & time: 12/03/19  1109  An emergency department physician performed an initial assessment on this suspected stroke patient at 1109.  History Chief Complaint  Patient presents with  . Code Stroke    Tora DuckSheryl Sessions is a 49 y.o. female.  Patient is a 49 year old female with a history of hypertension and reported connective tissue disorder who presents as a code stroke.  She reportedly had sudden onset of left side numbness to her left arm and left leg while she was stopping this morning.  She also had some weakness on that side.  On EMS arrival, she was noted to have some aphasia.  Code stroke was activated and patient was transported here.  Patient reports that she still having the symptoms.  She does report that she had some numbness in her left arm last week and saw her PCP for these findings.  She also reports that about a year ago she was seen in the emergency department for possible stroke with left-sided numbness and weakness and she reports they did not find anything at that time.  She does not report any associated headache.  No other recent illnesses.        Past Medical History:  Diagnosis Date  . Hypertension   . IBS (irritable bowel syndrome)   . Migraines   . Mixed connective tissue disease (HCC)     There are no problems to display for this patient.   Past Surgical History:  Procedure Laterality Date  . PARTIAL HYSTERECTOMY  2006     OB History   No obstetric history on file.     History reviewed. No pertinent family history.  Social History   Tobacco Use  . Smoking status: Never Smoker  . Smokeless tobacco: Never Used  Substance Use Topics  . Alcohol use: Yes    Comment: twice a month w/ dinner  . Drug use: Never    Home Medications Prior to Admission medications   Not on File    Allergies    Patient has no known allergies.  Review of Systems    Review of Systems  Constitutional: Negative for chills, diaphoresis, fatigue and fever.  HENT: Negative for congestion, rhinorrhea and sneezing.   Eyes: Negative.   Respiratory: Negative for cough, chest tightness and shortness of breath.   Cardiovascular: Negative for chest pain and leg swelling.  Gastrointestinal: Negative for abdominal pain, blood in stool, diarrhea, nausea and vomiting.  Genitourinary: Negative for difficulty urinating, flank pain, frequency and hematuria.  Musculoskeletal: Negative for arthralgias and back pain.  Skin: Negative for rash.  Neurological: Positive for speech difficulty, weakness and numbness. Negative for dizziness and headaches.    Physical Exam Updated Vital Signs BP 133/70   Pulse 66   Temp 98.6 F (37 C) (Oral)   Resp 16   Ht 5\' 6"  (1.676 m)   Wt 110.9 kg   SpO2 100%   BMI 39.46 kg/m   Physical Exam Constitutional:      Appearance: She is well-developed.  HENT:     Head: Normocephalic and atraumatic.  Eyes:     Pupils: Pupils are equal, round, and reactive to light.  Cardiovascular:     Rate and Rhythm: Normal rate and regular rhythm.     Heart sounds: Normal heart sounds.  Pulmonary:     Effort: Pulmonary effort is normal. No respiratory distress.     Breath sounds: Normal breath  sounds. No wheezing or rales.  Chest:     Chest wall: No tenderness.  Abdominal:     General: Bowel sounds are normal.     Palpations: Abdomen is soft.     Tenderness: There is no abdominal tenderness. There is no guarding or rebound.  Musculoskeletal:        General: Normal range of motion.     Cervical back: Normal range of motion and neck supple.  Lymphadenopathy:     Cervical: No cervical adenopathy.  Skin:    General: Skin is warm and dry.     Findings: No rash.  Neurological:     Mental Status: She is alert and oriented to person, place, and time.     Comments: Patient has some weakness of the left arm and left leg as compared to the  right but she is able to lift both extremities off the bed.  She has some numbness to palpation of the left side of the face as compared to the right.  She has some hesitancy with speech and a little bit of slurring of her words but does not seem to have true aphasia.     ED Results / Procedures / Treatments   Labs (all labs ordered are listed, but only abnormal results are displayed) Labs Reviewed  CBC - Abnormal; Notable for the following components:      Result Value   WBC 10.7 (*)    All other components within normal limits  DIFFERENTIAL - Abnormal; Notable for the following components:   Lymphs Abs 4.7 (*)    All other components within normal limits  COMPREHENSIVE METABOLIC PANEL - Abnormal; Notable for the following components:   CO2 21 (*)    Glucose, Bld 106 (*)    All other components within normal limits  I-STAT CHEM 8, ED - Abnormal; Notable for the following components:   BUN 5 (*)    Glucose, Bld 105 (*)    Calcium, Ion 1.00 (*)    TCO2 21 (*)    All other components within normal limits  CBG MONITORING, ED - Abnormal; Notable for the following components:   Glucose-Capillary 179 (*)    All other components within normal limits  RESPIRATORY PANEL BY RT PCR (FLU A&B, COVID)  PROTIME-INR  APTT  I-STAT BETA HCG BLOOD, ED (MC, [facility], AP ONLY)    EKG EKG Interpretation  Date/Time:  Saturday December 03 2019 12:33:59 EST Ventricular Rate:  78 PR Interval:    QRS Duration: 89 QT Interval:  377 QTC Calculation: 430 R Axis:   34 Text Interpretation: Sinus rhythm Low voltage, precordial leads No old tracing to compare Confirmed by Rolan Bucco [phone]) on 12/03/2019 12:42:08 PM   Radiology MR ANGIO HEAD WO CONTRAST  Result Date: 12/03/2019 CLINICAL DATA:  49 year old female code stroke presentation. Acute left side weakness and numbness, expressive aphasia. EXAM: MRI HEAD WITHOUT CONTRAST MRA HEAD WITHOUT CONTRAST TECHNIQUE: Multiplanar, multiecho pulse sequences  of the brain and surrounding structures were obtained without intravenous contrast. Angiographic images of the head were obtained using MRA technique without contrast. COMPARISON:  Plain head CT 1120 hours. FINDINGS: MRI HEAD FINDINGS Brain: No restricted diffusion or evidence of acute infarction. No midline shift, mass effect, evidence of mass lesion, ventriculomegaly, extra-axial collection or acute intracranial hemorrhage. Cervicomedullary junction and pituitary are within normal limits. Wallace Cullens and white matter signal is within normal limits throughout the brain. No convincing encephalomalacia. No chronic cerebral blood products identified. Vascular: Major intracranial  vascular flow voids are preserved. Skull and upper cervical spine: Negative visible cervical spine. Visualized bone marrow signal is within normal limits. Sinuses/Orbits: Negative orbits.  Paranasal sinuses are clear. Other: Mastoids are clear. Grossly normal visible internal auditory structures. Mild scaphocephaly. Visible scalp and face soft tissues appear negative. MRA HEAD FINDINGS Antegrade flow in the posterior circulation with dominant appearing distal left vertebral artery. No distal vertebral stenosis. Patent vertebrobasilar junction. Patent dominant appearing bilateral AICA origins. Patent basilar artery without stenosis. Patent SCA (duplicated on the left, normal variant) and right PCA origin. Fetal type left PCA origin. Right posterior communicating artery also present. Tortuous right P1 segment. Bilateral PCA branches are within normal limits. Antegrade flow in both ICA siphons. Mildly tortuous cavernous ICAs. No siphon stenosis. Bilateral ophthalmic and posterior communicating artery origins appear normal. Patent carotid termini, MCA and ACA origins. Anterior communicating artery and visible ACA branches are within normal limits. Bilateral MCA M1 segments and MCA bi/trifurcations are patent. The right MCA bifurcates early, normal  variant. Visible bilateral MCA branches are within normal limits. IMPRESSION: 1. Normal noncontrast MRI appearance of the brain. 2. Negative intracranial MRA. Electronically Signed   By: Odessa Fleming M.D.   On: 12/03/2019 12:19   MR BRAIN WO CONTRAST  Result Date: 12/03/2019 CLINICAL DATA:  49 year old female code stroke presentation. Acute left side weakness and numbness, expressive aphasia. EXAM: MRI HEAD WITHOUT CONTRAST MRA HEAD WITHOUT CONTRAST TECHNIQUE: Multiplanar, multiecho pulse sequences of the brain and surrounding structures were obtained without intravenous contrast. Angiographic images of the head were obtained using MRA technique without contrast. COMPARISON:  Plain head CT 1120 hours. FINDINGS: MRI HEAD FINDINGS Brain: No restricted diffusion or evidence of acute infarction. No midline shift, mass effect, evidence of mass lesion, ventriculomegaly, extra-axial collection or acute intracranial hemorrhage. Cervicomedullary junction and pituitary are within normal limits. Wallace Cullens and white matter signal is within normal limits throughout the brain. No convincing encephalomalacia. No chronic cerebral blood products identified. Vascular: Major intracranial vascular flow voids are preserved. Skull and upper cervical spine: Negative visible cervical spine. Visualized bone marrow signal is within normal limits. Sinuses/Orbits: Negative orbits.  Paranasal sinuses are clear. Other: Mastoids are clear. Grossly normal visible internal auditory structures. Mild scaphocephaly. Visible scalp and face soft tissues appear negative. MRA HEAD FINDINGS Antegrade flow in the posterior circulation with dominant appearing distal left vertebral artery. No distal vertebral stenosis. Patent vertebrobasilar junction. Patent dominant appearing bilateral AICA origins. Patent basilar artery without stenosis. Patent SCA (duplicated on the left, normal variant) and right PCA origin. Fetal type left PCA origin. Right posterior  communicating artery also present. Tortuous right P1 segment. Bilateral PCA branches are within normal limits. Antegrade flow in both ICA siphons. Mildly tortuous cavernous ICAs. No siphon stenosis. Bilateral ophthalmic and posterior communicating artery origins appear normal. Patent carotid termini, MCA and ACA origins. Anterior communicating artery and visible ACA branches are within normal limits. Bilateral MCA M1 segments and MCA bi/trifurcations are patent. The right MCA bifurcates early, normal variant. Visible bilateral MCA branches are within normal limits. IMPRESSION: 1. Normal noncontrast MRI appearance of the brain. 2. Negative intracranial MRA. Electronically Signed   By: Odessa Fleming M.D.   On: 12/03/2019 12:19   CT HEAD CODE STROKE WO CONTRAST  Result Date: 12/03/2019 CLINICAL DATA:  Code stroke. 49 year old female with left side weakness and aphasia. EXAM: CT HEAD WITHOUT CONTRAST TECHNIQUE: Contiguous axial images were obtained from the base of the skull through the vertex without intravenous contrast. COMPARISON:  None. FINDINGS: Brain: Mild scaphocephaly.  Normal cerebral volume. No midline shift, ventriculomegaly, mass effect, evidence of mass lesion, intracranial hemorrhage or evidence of cortically based acute infarction. Gray-white matter differentiation is within normal limits throughout the brain. Vascular: No suspicious intracranial vascular hyperdensity. Skull: Small benign right frontal bone lesions suspected on series 4, image 27. Otherwise negative. Sinuses/Orbits: Visualized paranasal sinuses and mastoids are stable and well pneumatized. Other: Visualized orbits and scalp soft tissues are within normal limits. ASPECTS [facility] Stroke Program Early CT Score) Total score (0-10 with 10 being normal): 10 IMPRESSION: Normal noncontrast CT appearance of the brain.  ASPECTS 10. These results were communicated to Dr. Otelia Limes at 11:26 am on 12/03/2019 by text page via the [facility] messaging system.  Electronically Signed   By: Odessa Fleming M.D.   On: 12/03/2019 11:27    Procedures Procedures (including critical care time)  Medications Ordered in ED Medications  sodium chloride flush (NS) 0.9 % injection 3 mL (3 mLs Intravenous Not Given 12/03/19 1157)    ED Course  I have reviewed the triage vital signs and the nursing notes.  Pertinent labs & imaging results that were available during my care of the patient were reviewed by me and considered in my medical decision making (see chart for details).    MDM Rules/Calculators/A&P                          Patient is a 49 year old female who presents as a code stroke.  She was seen by neurology on arrival and had some inconsistencies in her exam.  She had a head CT which showed no acute abnormalities.  She had an MRI and MRA of her brain which showed no acute abnormalities.  She has improving symptoms but is not yet back to baseline.  I spoke with Dr. Otelia Limes and he recommended since she is not back to baseline to admit her for observation.  Her labs are nonconcerning.  I spoke with the internal medicine resident on-call for unassigned who will admit the patient for further treatment. Final Clinical Impression(s) / ED Diagnoses Final diagnoses:  Left-sided weakness    Rx / DC Orders ED Discharge Orders    None       Rolan Bucco, MD 12/03/19 1420

## 2019-12-03 NOTE — ED Triage Notes (Signed)
Pt arrived to ED via [facility] as a CODE STROKE. LKW 0900. EMS reports pt was driving and started having L arm numbness. She stopped the car and s/s progressed to lightheadedness, LUE and LLE numbness and weakness and decreased sensation to the L side. Pt also began having expressive aphasia and slurred speech per EMS. EMS placed 20g R FA.  EMS VS: 88 HR NSR, BP 146/100, CBG 103 98% RA

## 2019-12-03 NOTE — ED Notes (Addendum)
Arrival to MRI

## 2019-12-03 NOTE — Consult Note (Signed)
Referring Physician: Dr. Fredderick Phenix    Chief Complaint: Acute onset of left sided weakness and numbness  HPI: Andrea Prince is an 49 y.o. female with a PMHx of mixed connective tissue disease, HTN and migraines who presents via EMS as a Code Stroke. She had onset of symptoms while shopping of left sided tingling that subsequently resolved. She got in her car and during driving she had recurrence of left sided paresthesias as well as weakness. EMS was called and they noted aphasia as well as the left sided weakness. Code Stroke was called and she was brought to the ED.   On arrival, there are several inconsistencies on exam. CT head shows no acute abnormality.   STAT MRI brain with MRA head was ordered to rule out stroke given the equivocal findings on her exam. MRI brain was normal. Her intracranial MRA was also normal.   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 Potential benefits of tPA are significantly outweighed by risks.   No past medical history on file.  PMHx Mixed connective tissue disease HTN Migraines  No family history on file. Social History:  has no history on file for tobacco use, alcohol use, and drug use.  Allergies: Not on File  Home Medications:  ASA 81  Coreg Lovastatin A migraine medication  ROS: As per HPI. Detailed ROS deferred in the context of acuity of presentation.   Physical Examination: There were no vitals taken for this visit.  HEENT: [place]/AT Lungs: Respirations unlabored Ext: No edema  Neurologic Examination: Mental Status: Awake and alert. Fully oriented. Speech is inconsistent: She can clearly enunciate her medications, but during finger naming her speech output is slow and stuttering with labored affect. She is able to name 3 digits correctly. She exhibits intact comprehension for all commands and questions.  Cranial Nerves: II:  Visual fields intact. PERRL.  III,IV, VI: No ptosis. EOMI. No  nystagmus.  V,VII: Smile symmetric but with hesitancy during initiation of movement. Temp subjectively decreased on the left.  VIII: hearing intact to voice IX,X: Palate elevates normally  XI: Decreased effort with shoulder shrug on the left, but able to hold head normally at the midline XII: Partly extends tongue so only the tip is showing, with quivering movements of the tongue. Limited protrusion is midline.  Motor: RUE and RLE 5/5 except for 4/5 ADF/APF. There is one instance when RUE dropped then was raised back up again during testing for [facility].  LUE elevates antigravity with labored affect and numerous instances where it drops and examiner elevates again, then pt hold antigravity for 1-2 seconds followed by arm dropping again. In some instances the patient re-elevates arm on her own. Labored affect throughout LUE testing. Resistance to examiner varies from 0/5 to 5/5 LLE elevates antigravity about 2 inches above bed. Resists with KE with inconsistent strength 4-5/5. ADF and APF 4-/5.  Sensory: Temp subjectively decreased to LUE and LLE. FT sensation present bilaterally to UE and LE. No extinction to DSS.  Deep Tendon Reflexes:  2+ bilateral brachioradialis, biceps, patellae and achilles.  Cerebellar: No ataxia with FNF bilaterally. Slow and labored on the left.  Gait: Deferred in the context of acuity of presentation  Results for orders placed or performed during the hospital encounter of 12/03/19 (from the past 48 hour(s))  CBC     Status: Abnormal   Collection Time: 12/03/19 11:11 AM  Result Value Ref Range   WBC 10.7 (H) 4.0 - 10.5 K/uL  RBC 4.89 3.87 - 5.11 MIL/uL   Hemoglobin 12.8 12.0 - 15.0 g/dL   HCT 31.4 36 - 46 %   MCV 86.1 80.0 - 100.0 fL   MCH 26.2 26.0 - 34.0 pg   MCHC 30.4 30.0 - 36.0 g/dL   RDW 97.0 26.3 - 78.5 %   Platelets 292 150 - 400 K/uL   nRBC 0.0 0.0 - 0.2 %    Comment: Performed at [facility], [address], [place], [place] [address]   Differential     Status: Abnormal   Collection Time: 12/03/19 11:11 AM  Result Value Ref Range   Neutrophils Relative % 48 %   Neutro Abs 5.1 1.7 - 7.7 K/uL   Lymphocytes Relative 44 %   Lymphs Abs 4.7 (H) 0.7 - 4.0 K/uL   Monocytes Relative 5 %   Monocytes Absolute 0.6 0.1 - 1.0 K/uL   Eosinophils Relative 2 %   Eosinophils Absolute 0.2 0.0 - 0.5 K/uL   Basophils Relative 0 %   Basophils Absolute 0.0 0.0 - 0.1 K/uL   Immature Granulocytes 1 %   Abs Immature Granulocytes 0.05 0.00 - 0.07 K/uL    Comment: Performed at [facility], [address] [place], [place] [address]  CBG monitoring, ED     Status: Abnormal   Collection Time: 12/03/19 11:12 AM  Result Value Ref Range   Glucose-Capillary 179 (H) 70 - 99 mg/dL    Comment: Glucose reference range applies only to samples taken after fasting for at least 8 hours.   Comment 1 Notify RN    Comment 2 Document in Chart   I-stat chem 8, ED     Status: Abnormal   Collection Time: 12/03/19 11:24 AM  Result Value Ref Range   Sodium 138 135 - 145 mmol/L   Potassium 4.5 3.5 - 5.1 mmol/L   Chloride 109 98 - 111 mmol/L   BUN 5 (L) 6 - 20 mg/dL   Creatinine, Ser 8.78 0.44 - 1.00 mg/dL   Glucose, Bld 676 (H) 70 - 99 mg/dL    Comment: Glucose reference range applies only to samples taken after fasting for at least 8 hours.   Calcium, Ion 1.00 (L) 1.15 - 1.40 mmol/L   TCO2 21 (L) 22 - 32 mmol/L   Hemoglobin 13.9 12.0 - 15.0 g/dL   HCT 72.0 36 - 46 %  I-Stat beta hCG blood, ED     Status: None   Collection Time: 12/03/19 11:24 AM  Result Value Ref Range   I-stat hCG, quantitative <5.0 <5 mIU/mL   Comment 3            Comment:   GEST. AGE      CONC.  (mIU/mL)   <=1 WEEK        5 - 50     2 WEEKS       50 - 500     3 WEEKS       100 - 10,000     4 WEEKS     1,000 - 30,000        FEMALE AND NON-PREGNANT FEMALE:     LESS THAN 5 mIU/mL    CT HEAD CODE STROKE WO CONTRAST  Result Date: 12/03/2019 CLINICAL DATA:  Code stroke.  49 year old female with left side weakness and aphasia. EXAM: CT HEAD WITHOUT CONTRAST TECHNIQUE: Contiguous axial images were obtained from the base of the skull through the vertex without intravenous contrast. COMPARISON:  None. FINDINGS: Brain: Mild scaphocephaly.  Normal cerebral volume. No midline shift, ventriculomegaly, mass effect, evidence of mass lesion, intracranial hemorrhage or evidence of cortically based acute infarction. Gray-white matter differentiation is within normal limits throughout the brain. Vascular: No suspicious intracranial vascular hyperdensity. Skull: Small benign right frontal bone lesions suspected on series 4, image 27. Otherwise negative. Sinuses/Orbits: Visualized paranasal sinuses and mastoids are stable and well pneumatized. Other: Visualized orbits and scalp soft tissues are within normal limits. ASPECTS [facility] Stroke Program Early CT Score) Total score (0-10 with 10 being normal): 10 IMPRESSION: Normal noncontrast CT appearance of the brain.  ASPECTS 10. These results were communicated to Dr. Otelia Limes at 11:26 am on 12/03/2019 by text page via the [facility] messaging system. Electronically Signed   By: Odessa Fleming M.D.   On: 12/03/2019 11:27    Assessment: 49 y.o. female with a PMHx of mixed connective tissue disease, HTN and migraines who presents via EMS as a Code Stroke. She had onset of symptoms while shopping of left sided tingling that subsequently resolved. She got in her car and during driving she had recurrence of left sided paresthesias as well as weakness. EMS was called and they noted aphasia as well as the left sided weakness. Code Stroke was called and she was brought to the ED.  1. On arrival, there were several inconsistencies on exam.  2. CT head showed no acute abnormality.  3. MRI brain was normal. Her intracranial MRA was also normal.  4. 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 Potential benefits of tPA are  significantly outweighed by risks.  5. Stroke Risk Factors - HTN  Recommendations: 1. HgbA1c, fasting lipid panel 2. Outpatient PCP and Neurology follow ups 3. Consider a Psychology consult to determine if there are any current psychosocial stressort 4. Continue ASA 81 mg po qd.  5. Neurohospitalist service will sign off. Please call if there are additional questions.    @Electronically  signed: Dr.  12/03/2019, 11:37 AM

## 2019-12-03 NOTE — ED Notes (Signed)
Neurologist was assessing pt and during arm drift assessment, pt had drift to R arm and was able to hold up L arm. Pt initially had L sided deficits.

## 2019-12-03 NOTE — Plan of Care (Signed)
  Problem: Education: Goal: Knowledge of General Education information will improve Description: Including pain rating scale, medication(s)/side effects and non-pharmacologic comfort measures Outcome: Progressing   Problem: Health Behavior/Discharge Planning: Goal: Ability to manage health-related needs will improve Outcome: Progressing   Problem: Nutrition: Goal: Risk of aspiration will decrease Outcome: Progressing

## 2019-12-03 NOTE — ED Notes (Signed)
Pt in MRI.

## 2019-12-04 DIAGNOSIS — M5412 Radiculopathy, cervical region: Secondary | ICD-10-CM | POA: Diagnosis not present

## 2019-12-04 DIAGNOSIS — I10 Essential (primary) hypertension: Secondary | ICD-10-CM | POA: Diagnosis not present

## 2019-12-04 DIAGNOSIS — R531 Weakness: Secondary | ICD-10-CM | POA: Diagnosis not present

## 2019-12-04 LAB — COMPREHENSIVE METABOLIC PANEL
ALT: 13 U/L (ref 0–44)
AST: 14 U/L — ABNORMAL LOW (ref 15–41)
Albumin: 3 g/dL — ABNORMAL LOW (ref 3.5–5.0)
Alkaline Phosphatase: 61 U/L (ref 38–126)
Anion gap: 8 (ref 5–15)
BUN: <5 mg/dL — ABNORMAL LOW (ref 6–20)
CO2: 22 mmol/L (ref 22–32)
Calcium: 8.7 mg/dL — ABNORMAL LOW (ref 8.9–10.3)
Chloride: 108 mmol/L (ref 98–111)
Creatinine, Ser: 0.95 mg/dL (ref 0.44–1.00)
GFR, Estimated: >60 mL/min (ref >60)
Glucose, Bld: 89 mg/dL (ref 70–99)
Potassium: 4 mmol/L (ref 3.5–5.1)
Sodium: 138 mmol/L (ref 135–145)
Total Bilirubin: 0.9 mg/dL (ref 0.3–1.2)
Total Protein: 6.4 g/dL — ABNORMAL LOW (ref 6.5–8.1)

## 2019-12-04 LAB — CBC
HCT: 40 % (ref 36.0–46.0)
Hemoglobin: 12.5 g/dL (ref 12.0–15.0)
MCH: 26.5 pg (ref 26.0–34.0)
MCHC: 31.3 g/dL (ref 30.0–36.0)
MCV: 84.9 fL (ref 80.0–100.0)
Platelets: 301 10*3/uL (ref 150–400)
RBC: 4.71 MIL/uL (ref 3.87–5.11)
RDW: 14.8 % (ref 11.5–15.5)
WBC: 10 10*3/uL (ref 4.0–10.5)
nRBC: 0 % (ref 0.0–0.2)

## 2019-12-04 LAB — TSH: TSH: 1.073 u[IU]/mL (ref 0.350–4.500)

## 2019-12-04 LAB — HIV ANTIBODY (ROUTINE TESTING W REFLEX): HIV Screen 4th Generation wRfx: NONREACTIVE

## 2019-12-04 NOTE — Hospital Course (Addendum)
#   Left Extremities weakness # Cervical radiculopathy   Presented with left sided paresthesias as well as weakness via EMS. Code Stroke was called. STAT MRI brain with MRA head was ordered to rule out stroke given the equivocal findings on her exam. MRI brain was normal. Her intracranial MRA was also normal. Had similar episodes in past in Left upper extremity and had an extensive workup. During her cardiac work up she was told, she had an abnormal heart rythm, although not seen on EKG.Diagnosed with cervical radiculopathy and is on gabapentin for it. New onset left lower extremity paresthesia & weakness which resolved in ED but decreased motor strength 4/5 in LUE & LLE. Not a presentation of TIA as she endorsing symptoms. Had extensive work up and results are From November 2021 - K4.5, creatinine 0.89, GFR 89, LFTs normal, LDL 136, HDL 42, TG 73, glucose 85, TSH 1.2, WBC 9.2, Hgb 13.5, MCV 81, platelets 294. TSH 0.679 from March, 2021. LDL 109, HDL 50, triglycerides 67 from August 13, 2018. Patient already on Aspirin & Statin therapy. HEr medications are reviewed an no side effects would be consistent with her presentation.  C spine MRI a few months ago didn't show cord or nerve root compression.  There is no evidence of vasculitis.  Plans to continue with her chiropractor and will f/u with her PCP and cardiologist;  hopes to seek a referral to a different neurology practice than the one she has been attending.  PT/OT have recommended ongoing outpatient treatment.    # Hypertension  # OSA Patient has h/o elevated blood pressure, takes Carvedilol at home. After recent sleep study, got diagnosed with OSA. States CPAP is back-ordered, will start using it, once it arrives. Continued on Carvedilol 3.125 mg BID   # Migraine Uses Emgality 120 mg as needed for her migraine. Had good relief with Maxalt 10 mh PRN but her cardiologist discontinued it.     # Connective Tissue Disease In past, in 2000 was diagnosed  with Lupus, then couple of years ago with Connective Tissue Disease. Typically she has very tight-feeling skin, burning skin, and pain in her joints. She takes Galcanezumab-gnlm 120 MG/ML SOAJ Inject 1 mL into the skin every 30 (thirty) days. MAINTENANCE DOSE 1 pen 11

## 2019-12-04 NOTE — Evaluation (Signed)
Occupational Therapy Evaluation Patient Details Name: Andrea Prince MRN: [id number] DOB: 12/10/1970 Today's Date: 12/04/2019    History of Present Illness Pt is a 49 y/o female admitted secondary to LUE and LLE pain and weakness. MRI negative. Thought to be radiculopathy vs TIA. PMH includes HTN, migraines, anxiety, and connective tissue disorder.    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 Pt reports increased pain with LUE shoulder ROM, pt demonstrates weakness in LUE (please see upper extremity assessment for additional details). Pt currently requires supervision for functional mobility without AD. Pt would benefit from follow-up OT services in outpatient clinic. Will continue to follow acutely.      Follow Up Recommendations  Outpatient OT    Equipment Recommendations  None recommended by OT    Recommendations for Other Services       Precautions / Restrictions Precautions Precautions: Fall Restrictions Weight Bearing Restrictions: No      Mobility Bed Mobility Overal bed mobility: Modified Independent                  Transfers Overall transfer level: Needs assistance Equipment used: None Transfers: Sit to/from Stand Sit to Stand: Supervision         General transfer comment: supervision for safety    Balance Overall balance assessment: Needs assistance Sitting-balance support: No upper extremity supported Sitting balance-Leahy Scale: Good     Standing balance support: During functional activity;No upper extremity supported Standing balance-Leahy Scale: Fair                             ADL either performed or assessed with clinical judgement   ADL Overall ADL's : Modified independent                                       General ADL Comments: pt able to complete ADL with  compensation for LUE weakness and pain limitations     Vision Patient Visual Report: No change from baseline Vision Assessment?: No apparent visual deficits     Perception     Praxis      Pertinent Vitals/Pain Pain Assessment: 0-10 Pain Score: 4  Faces Pain Scale: Hurts little more Pain Location: LUE  Pain Descriptors / Indicators: Grimacing;Guarding Pain Intervention(s): Limited activity within patient's tolerance;Monitored during [place];Ice applied     Hand Dominance Right   Extremity/Trunk Assessment Upper Extremity Assessment Upper Extremity Assessment: LUE deficits/detail LUE Deficits / Details: grossly 3/5, reports numbness/tingling in LUE from neck to finger tips, pt positive for empty can test with weakness and pain warranting further assessment/evaluation for supraspinatus tendon or muscle injury, pt with about 100* shoulder flexion AROM and PROM (Neer's test) with reproduction of pain in the shoulder, unable to progress further secondary to exacerbation of pain. Pt reports increased pain with palpation to glenohumeral joint. pt with pain to palpation of cervical spine on the lateral aspect of left side, muscles felt tight to touch LUE Sensation: decreased light touch LUE Coordination: decreased gross motor   Lower Extremity Assessment Lower Extremity Assessment: Defer to PT evaluation LLE Deficits / Details: Grossly 4/5 throughout. Did note slight buckling during stair navigation.    Cervical / Trunk Assessment Cervical / Trunk Assessment: Normal  Communication Communication Communication: No difficulties   Cognition Arousal/Alertness: Awake/alert Behavior During Therapy: WFL for tasks assessed/performed Overall Cognitive Status: Within Functional Limits for tasks assessed                                     General Comments  HR 70s-80s during [place]    Exercises Exercises: Shoulder Shoulder Exercises Pendulum Exercise: Left;10  reps;Seated Wrist Flexion: 10 reps;Seated;Left Wrist Extension: 10 reps;Seated;Left Neck Flexion: 10 reps;Seated Neck Extension: 10 reps;Seated Neck Lateral Flexion - Right: 10 reps;Seated Neck Lateral Flexion - Left: 10 reps;Seated   Shoulder Instructions      Home Living Family/patient expects to be discharged to:: Private residence Living Arrangements: Children Available Help at Discharge: Family Type of Home: Apartment Home Access: Stairs to enter Secretary/administrator of Steps: 2 flights Entrance Stairs-Rails: Right;Left Home Layout: One level     Bathroom Shower/Tub: Chief Strategy Officer: Standard     Home Equipment: Bedside commode          Prior Functioning/Environment Level of Independence: Independent        Comments: Works as a Education officer, community Problem List: Decreased strength;Decreased range of motion;Impaired sensation;Pain;Impaired UE functional use      OT Treatment/Interventions: Self-care/ADL training;Therapeutic exercise;Patient/family education;Therapeutic activities    OT Goals(Current goals can be found in the care plan section) Acute Rehab OT Goals Patient Stated Goal: to manage pain OT Goal Formulation: With patient Time For Goal Achievement: 12/18/19 Potential to Achieve Goals: Good ADL Goals Pt/caregiver will Perform Home Exercise Program: Left upper extremity;Increased ROM;Increased strength;Independently;With written HEP provided  OT Frequency: Min 1X/week   Barriers to D/C:            Co-evaluation              AM-PAC OT "6 Clicks" Daily Activity     Outcome Measure Help from another person eating meals?: None Help from another person taking care of personal grooming?: None Help from another person toileting, which includes using toliet, bedpan, or urinal?: None Help from another person bathing (including washing, rinsing, drying)?: None Help from another person to put on and taking off regular upper  body clothing?: None Help from another person to put on and taking off regular lower body clothing?: None 6 Click Score: 24   End of [place] Nurse Communication: Mobility status  Activity Tolerance: Patient tolerated treatment well Patient left: in bed;with call bell/phone within reach  OT Visit Diagnosis: Other abnormalities of gait and mobility (R26.89);Pain Pain - Right/Left: Left Pain - part of body: Arm;Shoulder                Time: 8756-4332 OT Time Calculation (min): 32 min Charges:  OT General Charges $OT Visit: 1 Visit OT Evaluation $OT Eval Moderate Complexity: 1 Mod OT Treatments $Self Care/Home Management : 8-22 mins  Rosey Bath OTR/L Acute Rehabilitation Services Office: [phone]   Rebeca Alert 12/04/2019, 10:36 AM

## 2019-12-04 NOTE — Progress Notes (Signed)
   Subjective:  HD#48 Patient evaluated at bedside this morning. She endorses feeling well. She was able to work with physical therapy this morning. Discussion about her ongoing condition. She was explained that all the results came negative and we could rule out stroke at this point. Felt relieved about the results, but anxious about not getting answer for her this problem. Plans to see her PCP after the discharge, going to follow up with Physical Therapy and planning to see her chiropractor more often.     Objective:  Vital signs in last 24 hours: Vitals:   12/03/19 2311 12/04/19 0412 12/04/19 0812 12/04/19 1214  BP: (!) 142/83 124/75 (!) 147/77 122/79  Pulse: 65 (!) 59 67 76  Resp: 18 18 18 16   Temp: 98.4 F (36.9 C) 98 F (36.7 C) 98 F (36.7 C) 98.1 F (36.7 C)  TempSrc: Oral Oral Oral Oral  SpO2: 99% 96% 98% 98%  Weight:      Height:       Physical Exam: General: Obese, middle age women lying comfortably in bed, NAD HEENT: Normocephalic, atraumatic, PERRL, MMM Abdomen: Bowel sounds+, ND/ NT, soft. Neuro: AAO*3, Intact speech/ No dysarthria, obey commands.  Motor: RUE & LUE and RLE 5/5, LUE &LLE 4/5  Sensory:Fine Touch sensation equal in UE & LE. Psych: Anxious mood and affect, Normal thought content, normal judgement.  Assessment/Plan: Andrea Prince is a 49 y.o.femalewith a  HTN, mixed connective tissue disease, migraines, IBS with constipation, GERD, anxiety who presents via EMS as a Code Stroke admitted for LLE weakness. Active Problems:   Left arm weakness  # Left Extremities weakness # Cervical radiculopathy  This morning decreased motor strength 4/5 in LUE & LLE. At this point possibility of cervical radiculopathy. Stroke, MS ruled out.  - C/W Aspirin 81 mg - C/W Pravastatin 20 mg - Follow up CBC, CMP - Follow up HIV, TSH - PT/OT eval  # Hypertension  # OSA Patient has h/o elevated blood pressure, takes Carvedilol at home. After recent sleep study,  got diagnosed with OSA. States CPAP is back-ordered, will start using it, once it arrives.   - Carvedilol 3.125 mg BID  # Migraine Uses Emgality 120 mg as needed for her migraine. Had good relief with Maxalt 10 mh PRN but her cardiologist discontinued it.    # Connective Tissue Disease In past, in 2000 was diagnosed with Lupus, then couple of years ago with Connective Tissue Disease. Typically she has very tight-feeling skin, burning skin, and pain in her joints. She takes Galcanezumab-gnlm 120 MG/ML SOAJ Inject 1 mL into the skin every 30 (thirty) days. MAINTENANCE DOSE 1 pen 11   Prior to Admission Living Arrangement: Home Anticipated Discharge Location: Home Barriers to Discharge: None Dispo: Anticipated discharge in approximately 0 day(s).   2001, MD 12/04/2019, 1:18 PM Pager: [phone] After 5pm on weekdays and 1pm on weekends: On Call pager [phone]

## 2019-12-04 NOTE — Progress Notes (Signed)
Pt aware PT referral written by md in discharge instructions does not include the required info necessary for an ambulatory referral as per case managers instruction and knowledge. Pt concerned for potentials barriers using current referral provided in discharge instructions. Pt express disappointment and frustration commenting she needs some direction. This rn and pt discussed following up with her primary as an option if current referral is not an acceptable referral. Pt finds this to be a satisfactory option.

## 2019-12-04 NOTE — Evaluation (Signed)
Physical Therapy Evaluation Patient Details Name: Andrea Prince MRN: [id number] DOB: 1970/08/17 Today's Date: 12/04/2019   History of Present Illness  Pt is a 49 y/o female admitted secondary to LUE and LLE pain and weakness. MRI negative. Thought to be radiculopathy vs TIA. PMH includes HTN, migraines, anxiety, and connective tissue disorder.   Clinical Impression  Pt is admitted secondary to problem above with deficits below. Noted weakness in LLE and pt with mild buckling during stair navigation. Pt requiring min guard A with mobility this [place]. Feel she would benefit from outpatient PT at d/c. Will continue to follow acutely.     Follow Up Recommendations Outpatient PT    Equipment Recommendations  None recommended by PT    Recommendations for Other Services       Precautions / Restrictions Precautions Precautions: Fall Restrictions Weight Bearing Restrictions: No      Mobility  Bed Mobility Overal bed mobility: Modified Independent                  Transfers Overall transfer level: Needs assistance Equipment used: None Transfers: Sit to/from Stand Sit to Stand: Min guard         General transfer comment: Min guard for safety.   Ambulation/Gait Ambulation/Gait assistance: Min guard Gait Distance (Feet): 120 Feet Assistive device: None Gait Pattern/deviations: Step-through pattern;Decreased stride length Gait velocity: Decreased   General Gait Details: Very cautious gait. Slower speed. Pt reports feeling muscle fatigue and some weaknes in LLE.   Stairs Stairs: Yes Stairs assistance: Min guard;Min assist Stair Management: One rail Right;Step to pattern;Forwards Number of Stairs: 5 General stair comments: Pt educated on LE sequencing. Did note mild buckling in LLE upon descending step X1. Min A for steadying. Otherwise requiring min guard A for safety.   Wheelchair Mobility    Modified Rankin (Stroke Patients Only)       Balance Overall  balance assessment: Needs assistance Sitting-balance support: No upper extremity supported Sitting balance-Leahy Scale: Good     Standing balance support: During functional activity;No upper extremity supported Standing balance-Leahy Scale: Fair                               Pertinent Vitals/Pain Pain Assessment: Faces Faces Pain Scale: Hurts little more Pain Location: LUE  Pain Descriptors / Indicators: Grimacing;Guarding Pain Intervention(s): Monitored during [place];Limited activity within patient's tolerance;Repositioned    Home Living Family/patient expects to be discharged to:: Private residence Living Arrangements: Children Available Help at Discharge: Family Type of Home: Apartment Home Access: Stairs to enter Entrance Stairs-Rails: Doctor, general practice of Steps: 2 flights Home Layout: One level Home Equipment: Bedside commode      Prior Function Level of Independence: Independent         Comments: Works as a Oceanographer        Extremity/Trunk Assessment   Upper Extremity Assessment Upper Extremity Assessment: Defer to OT evaluation    Lower Extremity Assessment Lower Extremity Assessment: LLE deficits/detail LLE Deficits / Details: Grossly 4/5 throughout. Did note slight buckling during stair navigation.     Cervical / Trunk Assessment Cervical / Trunk Assessment: Normal  Communication   Communication: No difficulties  Cognition Arousal/Alertness: Awake/alert Behavior During Therapy: WFL for tasks assessed/performed Overall Cognitive Status: Within Functional Limits for tasks assessed  General Comments      Exercises     Assessment/Plan    PT Assessment Patient needs continued PT services  PT Problem List Decreased strength;Decreased balance;Decreased mobility;Pain       PT Treatment Interventions Gait training;Functional mobility  training;Stair training;Therapeutic activities;Therapeutic exercise;Balance training;Patient/family education    PT Goals (Current goals can be found in the Care Plan section)  Acute Rehab PT Goals Patient Stated Goal: to feel better PT Goal Formulation: With patient Time For Goal Achievement: 12/18/19 Potential to Achieve Goals: Good    Frequency Min 3X/week   Barriers to discharge        Co-evaluation               AM-PAC PT "6 Clicks" Mobility  Outcome Measure Help needed turning from your back to your side while in a flat bed without using bedrails?: None Help needed moving from lying on your back to sitting on the side of a flat bed without using bedrails?: None Help needed moving to and from a bed to a chair (including a wheelchair)?: A Little Help needed standing up from a chair using your arms (e.g., wheelchair or bedside chair)?: A Little Help needed to walk in hospital room?: A Little Help needed climbing 3-5 steps with a railing? : A Little 6 Click Score: 20    End of [place] Equipment Utilized During Treatment: Gait belt Activity Tolerance: Patient tolerated treatment well Patient left: in bed;with call bell/phone within reach Nurse Communication: Mobility status PT Visit Diagnosis: Unsteadiness on feet (R26.81);Muscle weakness (generalized) (M62.81)    Time: 7017-7939 PT Time Calculation (min) (ACUTE ONLY): 14 min   Charges:   PT Evaluation $PT Eval Low Complexity: 1 Low          Cindee Salt, DPT  Acute Rehabilitation Services  Pager: [phone] Office: [phone]   Lehman Prom 12/04/2019, 10:05 AM

## 2019-12-04 NOTE — Care Management (Signed)
1435 12-04-19 Physical/Occupational Therapy recommended outpatient physical and occupational therapy- Patient will need a paper Rx to take to the outpatient rehab of choice-on Rx it needs to read outpatient PT/OT evaluation and treatment. Address states that patient lives in [place] to make an ambulatory referral. Graves-Bigelow, Lamar Laundry , RN,BSN Case Manager

## 2019-12-05 NOTE — Discharge Summary (Signed)
Name: Andrea Prince MRN: [id number] DOB: October 23, 1970 49 y.o. PCP: Patient, No Pcp Per  Date of Admission: 12/03/2019 11:10 AM Date of Discharge: 12/04/2019 Attending Physician: No att. providers found  Discharge Diagnosis: 1. Left Extremity Weakness 2. Hypertension 3. Migraine 4. OSA 5. Migraine 6. Connective Tissue Disease  Discharge Medications: Allergies as of 12/04/2019   No Known Allergies     Medication List    TAKE these medications   acetaminophen 650 MG CR tablet Commonly known as: TYLENOL Take 1,300 mg by mouth 2 (two) times daily as needed for pain.   aspirin EC 81 MG tablet Take 81 mg by mouth daily. Swallow whole.   carvedilol 3.125 MG tablet Commonly known as: COREG Take 3.125 mg by mouth 2 (two) times daily.   chlorzoxazone 500 MG tablet Commonly known as: PARAFON Take 500 mg by mouth daily as needed (migraines).   ELDERBERRY PO Take 1 capsule by mouth daily.   esomeprazole 40 MG capsule Commonly known as: NEXIUM Take 40 mg by mouth daily as needed (acid reflux).   fexofenadine 180 MG tablet Commonly known as: ALLEGRA Take 180 mg by mouth daily as needed (seasonal allergies).   fluticasone 50 MCG/ACT nasal spray Commonly known as: FLONASE Place 1 spray into both nostrils daily as needed (seasonal allergies).   gabapentin 100 MG capsule Commonly known as: NEURONTIN Take 100 mg by mouth at bedtime.   Galcanezumab-gnlm 120 MG/ML Soaj Inject 120 mg into the skin every 30 (thirty) days.   guanFACINE 2 MG Tb24 ER tablet Commonly known as: INTUNIV Take 2 mg by mouth at bedtime.   multivitamin with minerals Tabs tablet Take 1 tablet by mouth daily.   pravastatin 20 MG tablet Commonly known as: PRAVACHOL Take 20 mg by mouth at bedtime.   Vitamin D (Ergocalciferol) 1.25 MG (50000 UNIT) Caps capsule Commonly known as: DRISDOL Take 50,000 Units by mouth every Sunday.   Zinc 50 MG Tabs Take 50 mg by mouth daily.       Disposition  and follow-up:   Ms.Keta Syracuse was discharged from [facility] in Stable condition.  At the hospital follow up visit please address:  1. - Left Extremity Weakness : Please follow up with outpatient PT.     -  Hypertension : Please continue Carvediol 3.125 mg BID and see PCP if needed.     -Migraine: Please see neurologist if needed.     - OSA: Please start using CPAP at home.     - Connective Tissue Disease: No further follow up at this time, Please see PCP if needed.   2.  Labs / imaging needed at time of follow-up: None  3.  Pending labs/ test needing follow-up: None  Follow-up Appointments: N/A   Hospital Course by problem list: # Left Extremities weakness # Cervical radiculopathy   Presented with left sided paresthesias as well as weakness via EMS. Code Stroke was called. STAT MRI brain with MRA head was ordered to rule out stroke given the equivocal findings on her exam. MRI brain was normal. Her intracranial MRA was also normal. Had similar episodes in past in Left upper extremity and had an extensive workup. During her cardiac work up she was told, she had an abnormal heart rythm, although not seen on EKG.Diagnosed with cervical radiculopathy and is on gabapentin for it. New onset left lower extremity paresthesia & weakness which resolved in ED but decreased motor strength 4/5 in LUE & LLE. Not a presentation of  TIA as she endorsing symptoms. Had extensive work up and results are From November 2021 - K4.5, creatinine 0.89, GFR 89, LFTs normal, LDL 136, HDL 42, TG 73, glucose 85, TSH 1.2, WBC 9.2, Hgb 13.5, MCV 81, platelets 294. TSH 0.679 from March, 2021. LDL 109, HDL 50, triglycerides 67 from August 13, 2018. Patient already on Aspirin & Statin therapy. HEr medications are reviewed an no side effects would be consistent with her presentation.  C spine MRI a few months ago didn't show cord or nerve root compression.  There is no evidence of vasculitis.  Plans to  continue with her chiropractor and will f/u with her PCP and cardiologist;  hopes to seek a referral to a different neurology practice than the one she has been attending.  PT/OT have recommended ongoing outpatient treatment.    # Hypertension  # OSA Patient has h/o elevated blood pressure, takes Carvedilol at home. After recent sleep study, got diagnosed with OSA. States CPAP is back-ordered, will start using it, once it arrives. Continued on Carvedilol 3.125 mg BID   # Migraine Uses Emgality 120 mg as needed for her migraine. Had good relief with Maxalt 10 mh PRN but her cardiologist discontinued it.     # Connective Tissue Disease In past, in 2000 was diagnosed with Lupus, then couple of years ago with Connective Tissue Disease. Typically she has very tight-feeling skin, burning skin, and pain in her joints. She takes Galcanezumab-gnlm 120 MG/ML SOAJ Inject 1 mL into the skin every 30 (thirty) days. MAINTENANCE DOSE 1 pen 11     Discharge Vitals:   BP 122/79 (BP Location: Right Arm)   Pulse 76   Temp 98.1 F (36.7 C) (Oral)   Resp 16   Ht 5\' 6"  (1.676 m)   Wt 244 lb 7.8 oz (110.9 kg)   SpO2 98%   BMI 39.46 kg/m   Pertinent Labs, Studies, and Procedures:  CBC Latest Ref Rng & Units 12/04/2019 12/03/2019 12/03/2019  WBC 4.0 - 10.5 K/uL 10.0 - 10.7(H)  Hemoglobin 12.0 - 15.0 g/dL 12/05/2019 40.0 86.7  Hematocrit 36 - 46 % 40.0 41.0 42.1  Platelets 150 - 400 K/uL 301 - 292   BMP Latest Ref Rng & Units 12/04/2019 12/03/2019 12/03/2019  Glucose 70 - 99 mg/dL 89 12/05/2019) 509(T)  BUN 6 - 20 mg/dL 267(T) 5(L) 6  Creatinine 0.44 - 1.00 mg/dL <2(W 5.80 9.98  Sodium 135 - 145 mmol/L 138 138 137  Potassium 3.5 - 5.1 mmol/L 4.0 4.5 3.8  Chloride 98 - 111 mmol/L 108 109 105  CO2 22 - 32 mmol/L 22 - 21(L)  Calcium 8.9 - 10.3 mg/dL 3.38) - 9.0   CT head wo Contrast  IMPRESSION: Normal noncontrast CT appearance of the brain  MR Brain wo contrast  IMPRESSION: 1. Normal noncontrast MRI  appearance of the brain.  2. Negative intracranial MRA.  Discharge Instructions: Discharge Instructions    Ambulatory referral to Physical Therapy   Complete by: As directed    PT AND OT EVAL AND TREAT   Iontophoresis - 4 mg/ml of dexamethasone: No   T.E.N.S. Unit Evaluation and Dispense as Indicated: No   Call MD for:  difficulty breathing, headache or visual disturbances   Complete by: As directed    Call MD for:  extreme fatigue   Complete by: As directed    Call MD for:  persistant dizziness or light-headedness   Complete by: As directed    Diet - low sodium heart  healthy   Complete by: As directed    Discharge instructions   Complete by: As directed    Ms. Andrea Prince- You presented withleft sided paresthesias as well as weaknessviaEMS.Code Stroke was called.STAT MRIbrainwith MRAhead wasordered to rule out stroke given the equivocal findings on your exam.MRI brain was normal. Intracranial MRA was also normal.Had similar episodes in past in Left upper extremity and had an extensive workup. You stated during your cardiac work up you were told, you have an abnormal heart rythm, although not seen on EKG. You were diagnosed with cervical radiculopathy and is on gabapentin for it. New onset left lower extremity paresthesia & weakness which resolved in ED but decreased motor strength 4/5 in LUE & LLE. Unclear diagnosis at this point, but ruled out stroke or TIA.   Increase your activity as tolerated. Please use PT instruction to climb your steps to 3rd floor. You are planning to follow up with your PCP, see Neurologist, work with PT & see you Chiropractor.  Hope you feel better! Take care!   Increase activity slowly   Complete by: As directed       Signed: Arnoldo Lenis, MD 12/06/2019, 12:37 PM   Pager: [phone]

## 2021-05-24 ENCOUNTER — Other Ambulatory Visit: Payer: Self-pay | Admitting: Gastroenterology

## 2021-05-24 DIAGNOSIS — K8689 Other specified diseases of pancreas: Secondary | ICD-10-CM

## 2021-05-27 ENCOUNTER — Ambulatory Visit
Admission: RE | Admit: 2021-05-27 | Discharge: 2021-05-27 | Disposition: A | Discharge status: Home / Self Care | Payer: BC Managed Care – PPO | Source: Ambulatory Visit | Attending: Gastroenterology | Admitting: Gastroenterology

## 2021-05-27 DIAGNOSIS — K8689 Other specified diseases of pancreas: Secondary | ICD-10-CM

## 2021-05-27 IMAGING — MR MR ABDOMEN WO/W CM
13 of 19 series · 28 of 48 positions shown · IV contrast (multihance)
Comparison: None Available.

CLINICAL DATA: Abnormal CT, pancreas

EXAM:
MRI ABDOMEN WITHOUT AND WITH CONTRAST
TECHNIQUE: Multiplanar multisequence MR imaging of the abdomen was performed
both before and after the administration of intravenous contrast.
CONTRAST:  20mL MULTIHANCE GADOBENATE DIMEGLUMINE 529 MG/ML IV SOLN

[Series 3: cor haste · coronal · 5.0mm · 0.74mm/px · 2 of 36 slices shown]
[im 1/36]
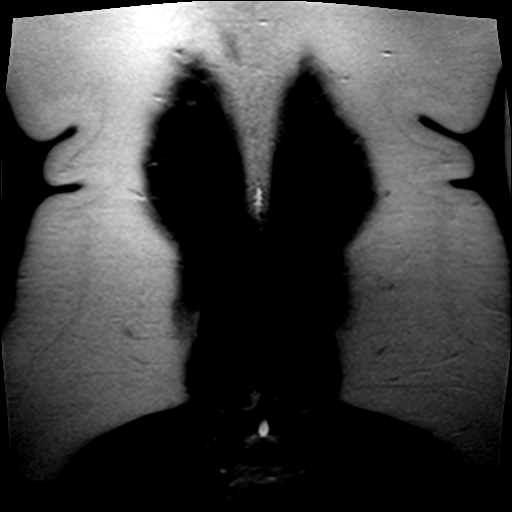
[im 36/36]
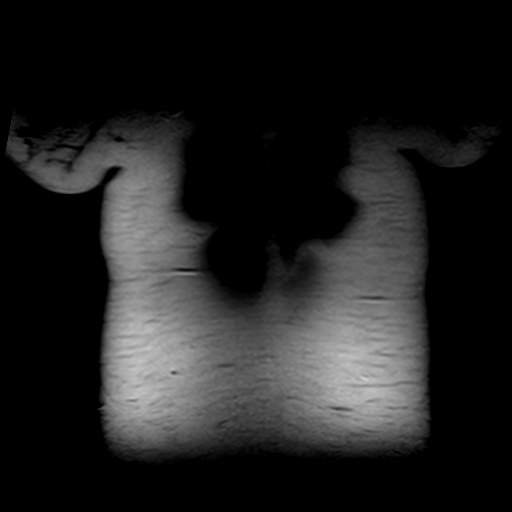

[Series 4: axial haste · axial · 6.0mm · 0.78mm/px · z∈[-90,+158]mm · 2 of 39 slices shown]
[im 1/39]
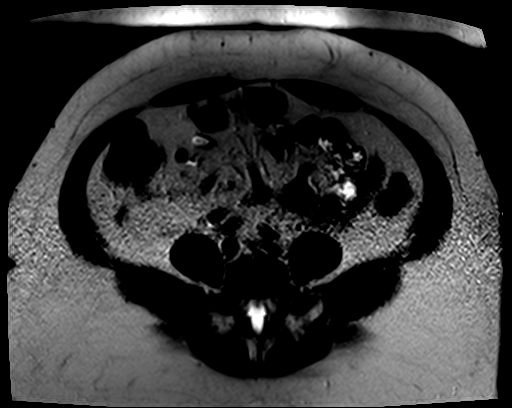
[im 39/39]
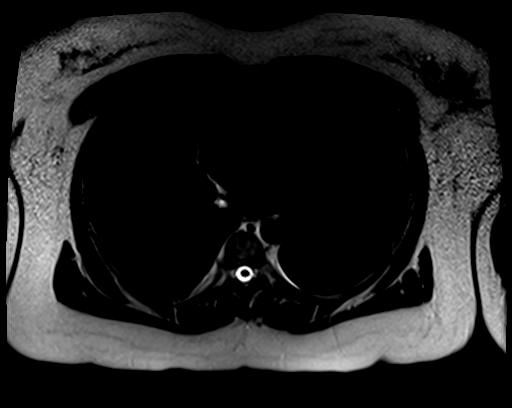

[Series 9: ep2d_diff_b50_500_800_p2_trig · axial · 6.0mm · 2.03mm/px · z∈[-94,+172]mm · 5 of 114 slices shown]
[im 1/114]
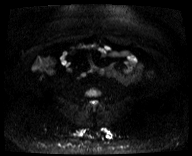
[im 29/114]
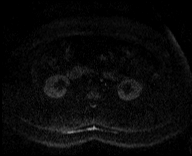
[im 57/114]
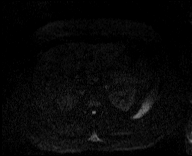
[im 85/114]
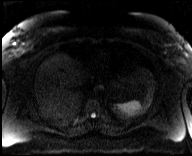
[im 114/114]
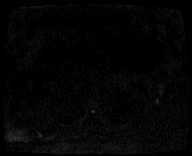

[Series 10: ep2d_[id number]_500_800_p2_trig_adc · axial · 6.0mm · 2.03mm/px · 1 of 38 slices shown]
[im 1/38]
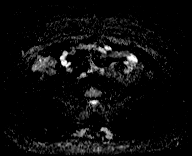

[Series 11: bSSFP · axial · 4.0mm · 0.72mm/px · z∈[-88,+149]mm · 2 of 61 slices shown (1 of 2)]
[im 1/61]
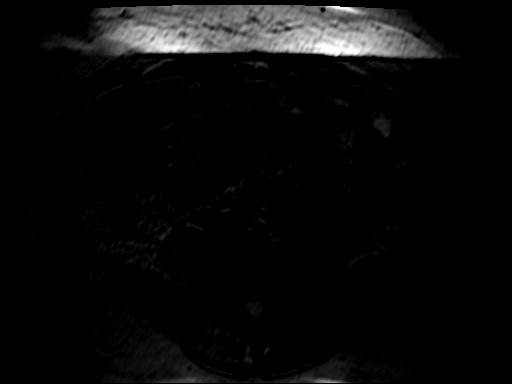
[im 61/61]
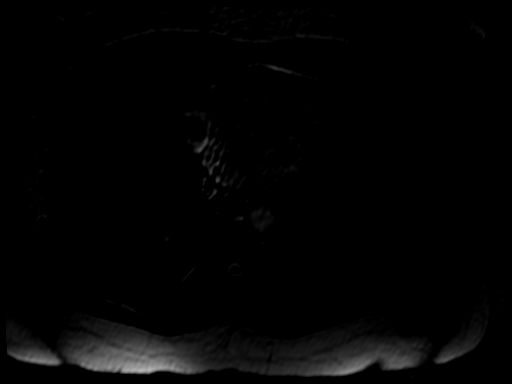

[Series 12: bSSFP · coronal · 5.0mm · 0.78mm/px · 1 of 34 slices shown (2 of 2)]
[im 1/34]
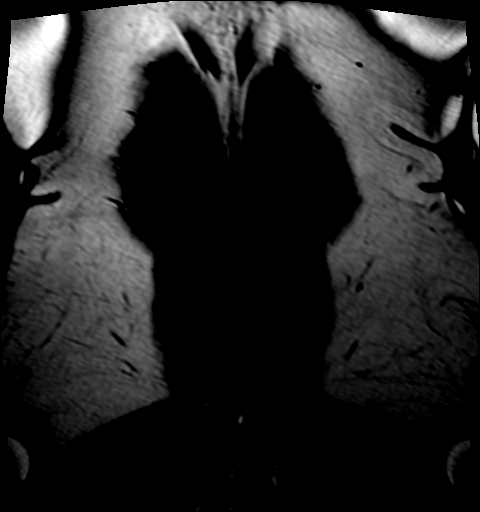

[Series 13: T2 · coronal · 3.0mm · 0.70mm/px · 2 of 61 slices shown]
[im 1/61]
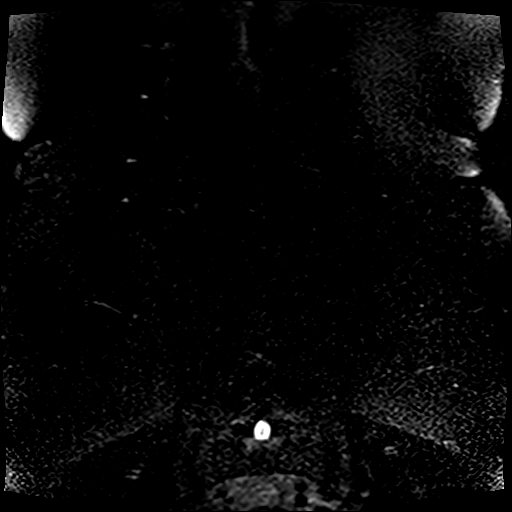
[im 61/61]
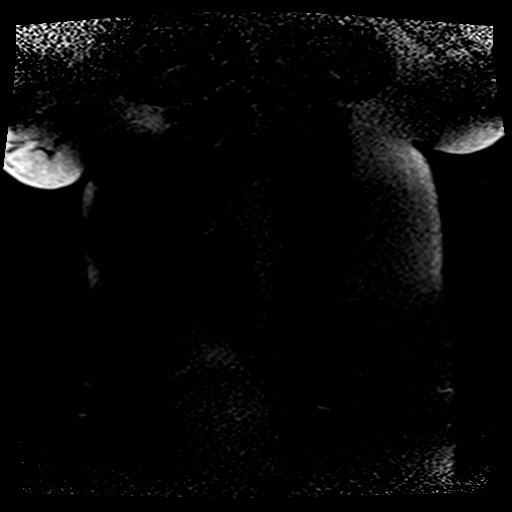

[Series 14: T2 fat-sat · axial · 6.0mm · 1.09mm/px · 1 of 38 slices shown]
[im 1/38]
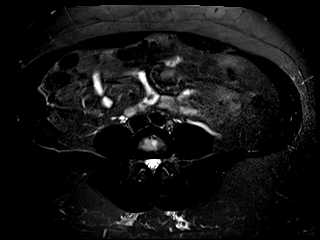

[Series 15: T1 · axial · 6.0mm · 0.74mm/px · z∈[-83,+145]mm · 2 of 72 slices shown]
[im 1/72]
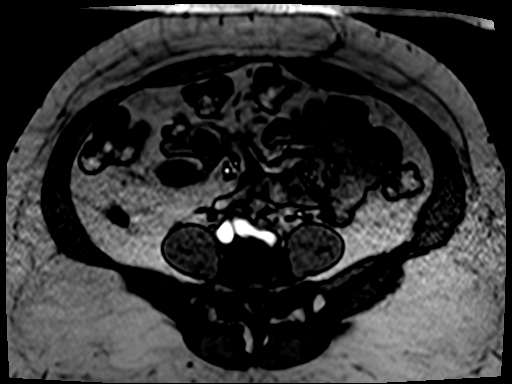
[im 72/72]
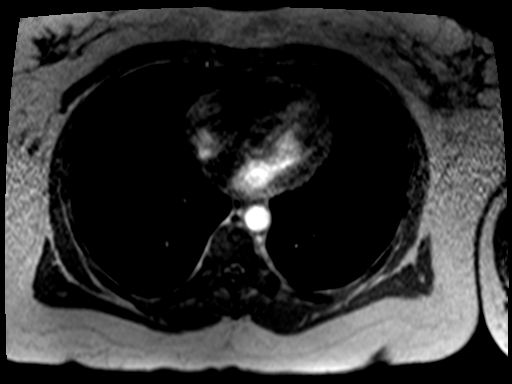

[Series 16: T1 dynamic · axial · non-contrast · 2.5mm · 0.74mm/px · z∈[-93,+161]mm · 3 of 104 slices shown]
[im 1/104]
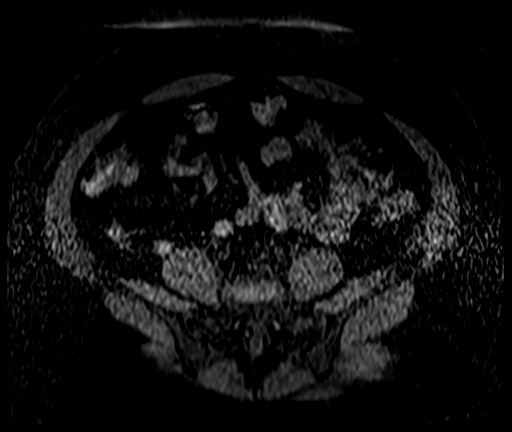
[im 52/104]
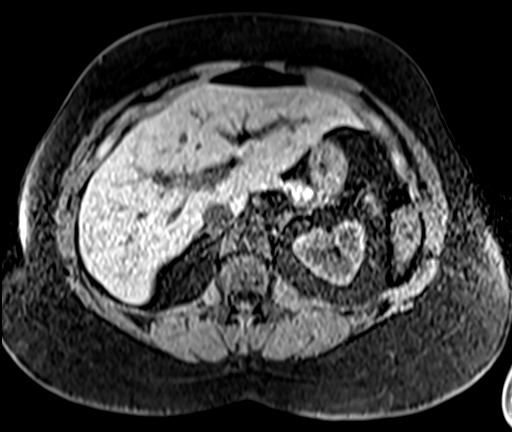
[im 104/104]
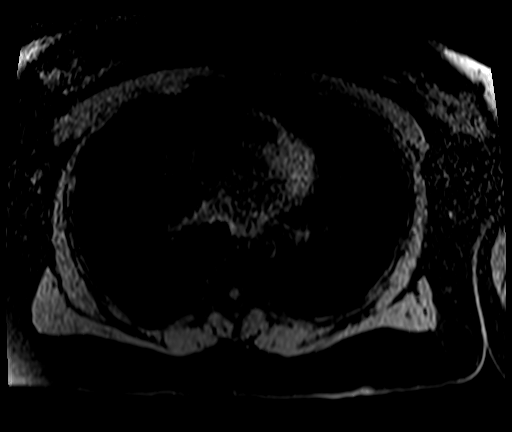

[Series 17: T1 dynamic post-contrast · axial · 2.5mm · 0.74mm/px · z∈[-93,+161]mm · 3 of 104 slices shown (1 of 3)]
[im 1/104]
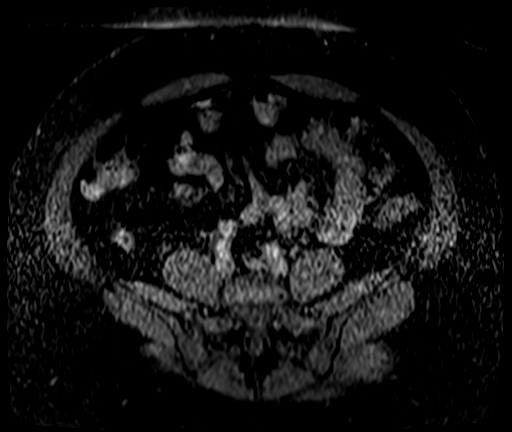
[im 52/104]
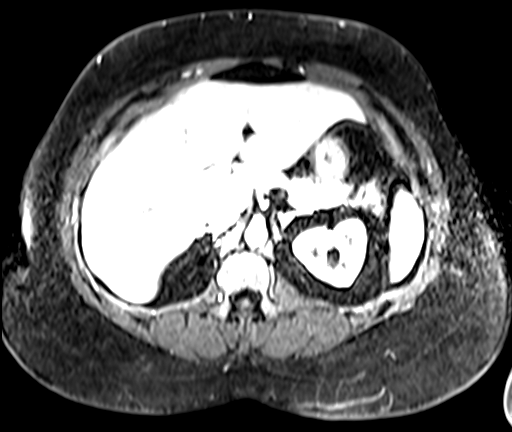
[im 104/104]
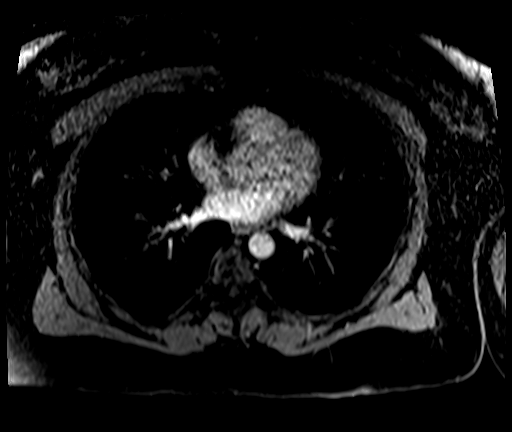

[Series 18: T1 dynamic post-contrast · axial · 2.5mm · 0.74mm/px · z∈[-93,+161]mm · 3 of 104 slices shown (2 of 3)]
[im 1/104]
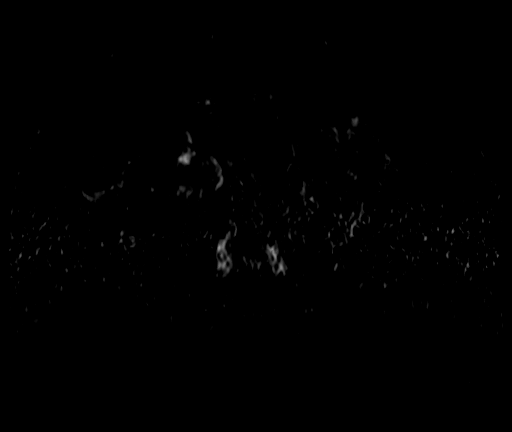
[im 52/104]
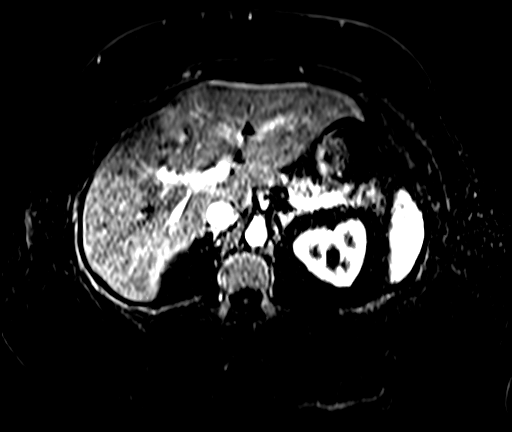
[im 104/104]
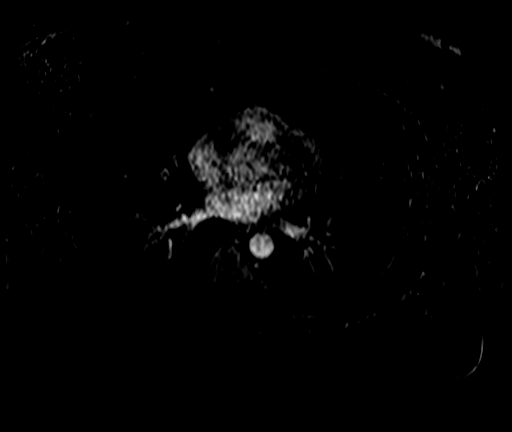

[Series 19: T1 dynamic post-contrast · axial · 2.5mm · 0.74mm/px · 1 of 104 slices shown (3 of 3)]
[im 1/104]
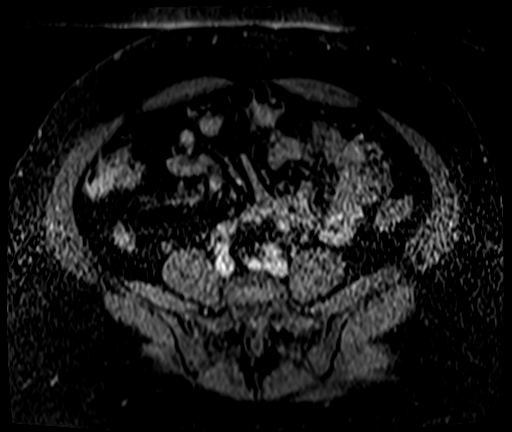

[28 of 48 positions shown; findings below may reference images not displayed]

FINDINGS: Limited study due to motion.

Lower chest: No acute findings.

Hepatobiliary: Liver is enlarged measuring 19.9 cm in length. No
suspicious hepatic mass identified. Gallbladder appears normal. No
biliary ductal dilatation.

Pancreas: No mass, inflammatory changes, or other parenchymal
abnormality identified.

Spleen:  Within normal limits in size and appearance.

Adrenals/Urinary Tract: Adrenal glands appear normal. Kidneys are
unremarkable. No hydronephrosis or enhancing renal mass identified.

Stomach/Bowel: Visualized portions within the abdomen are
unremarkable.

Vascular/Lymphatic: No pathologically enlarged lymph nodes
identified. No abdominal aortic aneurysm demonstrated.

Other:  No ascites.

Musculoskeletal: No suspicious bone lesions identified.
IMPRESSION: 1. No acute process or suspicious mass identified in the abdomen.
2. Hepatomegaly.
3. Limited study due to motion.

## 2021-05-27 MED ORDER — GADOBENATE DIMEGLUMINE 529 MG/ML IV SOLN
20.0000 mL | Freq: Once | INTRAVENOUS | Status: AC | PRN
  Administered 2021-05-27: 20 mL via INTRAVENOUS
